# Patient Record
Sex: Male | Born: 1985 | Race: White | Hispanic: No | Marital: Married | Smoking: Former smoker
Health system: Southern US, Community
[De-identification: ages and names within clinical notes are randomized; demographics above are authoritative.]

## PROBLEM LIST (undated history)

## (undated) DIAGNOSIS — I861 Scrotal varices: Secondary | ICD-10-CM

## (undated) HISTORY — PX: WISDOM TOOTH EXTRACTION: SHX21

## (undated) HISTORY — PX: OTHER SURGICAL HISTORY: SHX169

---
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
## 2004-03-08 ENCOUNTER — Emergency Department (HOSPITAL_COMMUNITY): Admission: EM | Admit: 2004-03-08 | Discharge: 2004-03-08 | Payer: Self-pay | Admitting: Emergency Medicine

## 2004-03-15 ENCOUNTER — Emergency Department (HOSPITAL_COMMUNITY): Admission: EM | Admit: 2004-03-15 | Discharge: 2004-03-15 | Payer: Self-pay | Admitting: Emergency Medicine

## 2004-08-07 ENCOUNTER — Emergency Department (HOSPITAL_COMMUNITY): Admission: EM | Admit: 2004-08-07 | Discharge: 2004-08-07 | Payer: Self-pay | Admitting: Emergency Medicine

## 2004-08-17 ENCOUNTER — Emergency Department (HOSPITAL_COMMUNITY): Admission: EM | Admit: 2004-08-17 | Discharge: 2004-08-17 | Payer: Self-pay | Admitting: Emergency Medicine

## 2010-10-17 ENCOUNTER — Encounter
Admission: RE | Admit: 2010-10-17 | Discharge: 2010-10-17 | Payer: Self-pay | Source: Home / Self Care | Attending: Family Medicine | Admitting: Family Medicine

## 2010-10-19 ENCOUNTER — Ambulatory Visit (HOSPITAL_COMMUNITY)
Admission: RE | Admit: 2010-10-19 | Discharge: 2010-10-19 | Payer: Self-pay | Source: Home / Self Care | Attending: Gastroenterology | Admitting: Gastroenterology

## 2011-02-16 NOTE — Op Note (Signed)
[facility]  Patient:    Daniel Garrett, Daniel Garrett                     MRN: [id number] Proc. Date: 03/21/01 Adm. Date:  [id number] Attending:  Trisha Mangle                           Operative Report  PREOPERATIVE DIAGNOSIS:  Left varicocele.  POSTOPERATIVE DIAGNOSIS:  Left varicocele.  PROCEDURE:  Left varicocelectomy.  SURGEON:  Dr. Vernie Ammons.  ANESTHESIA:  General LMA.  SPECIMENS:  None.  DRAINS:  None.  ESTIMATED BLOOD LOSS:  Less than 5 cc.  COMPLICATIONS:  None.  INDICATIONS FOR PROCEDURE:  The patients a 25 year old white male who was found to have a left varicocele on routine physical examination. We have gone over the risks, complications, alternatives and limitations as well as the fertility and testicular development issues associated with varicocele. He understands and has elected to proceed with left varicocelectomy.  DESCRIPTION OF PROCEDURE:  After informed consent, the patient was brought to the major OR, placed on the table, administered general anesthesia and his genitalia was sterilely prepped and draped. The left varicocele was easily seen and a subinguinal incision was then made, carried down through the subcu tissue and superficial fascia where the superficial inferior epigastric vein was identified, ligated with 4-0 silk and divided. The distal 2 cm of the canal were opened by incising the overlying fascia and the ilioinguinal nerve was clearly identified and isolated. The cord was then dissected from the surrounding tissue with a Administrator, [employer] and a Penrose was placed beneath it. The overlying tissues were then incised along the length of the cord. The large veins were identified, doubly ligated with silk suture and divided. The testicular artery was identified and isolated as was the vas. All abnormally enlarged veins were then divided and I then allowed the cord to drop back in its normal anatomic position. The distal  external oblique fascia was reapproximated with a running 3-0 Vicryl suture to cover the inguinal canal back to its normal distance. I then infiltrated the subcu tissue in the area of the ilioinguinal nerve with 0.5% plain Marcaine. The deep fascia was closed with running 3-0 Vicryl and interrupted 3-0 chromic were used to reapproximate the subcu tissue loosely. I then closed the skin incision with a running 4-0 subcuticular Vicryl suture. Dermabond was then applied to the incision and the patient was awakened and taken to the recovery room in stable satisfactory condition. The patient tolerated the procedure well and there were no intraoperative complications. Needle, sponge and instrument counts were reported as correct at the end of the operation.  He will given a prescription for 38 Vicodin ES and follow-up in my office in 7-10 days. DD:  03/21/01 TD:  03/21/01 Job: [id number]

## 2012-03-04 ENCOUNTER — Emergency Department: Payer: Self-pay | Admitting: Emergency Medicine

## 2012-05-23 IMAGING — CT CT ABD-PELV W/ CM
2 of 4 series · 17 of 46 positions shown, 19 images · IV contrast ([ID] [id number])
Comparison: None.

CLINICAL DATA: Right lower quadrant abdominal pain.  Anorexia.

CT ABDOMEN AND PELVIS WITH CONTRAST 10/17/2010:
TECHNIQUE: Multidetector CT imaging of the abdomen and pelvis was
performed following the standard protocol during bolus
administration of intravenous contrast.
Contrast: 125 ml 1mnipaque-CUU IV.  Oral contrast also
administered.

[Series 2: abdomen w/ · axial · 0.70mm/px · z∈[-378,-18]mm · 14 of 80 slices shown, 16 images]
[im 4/80  soft-tissue]
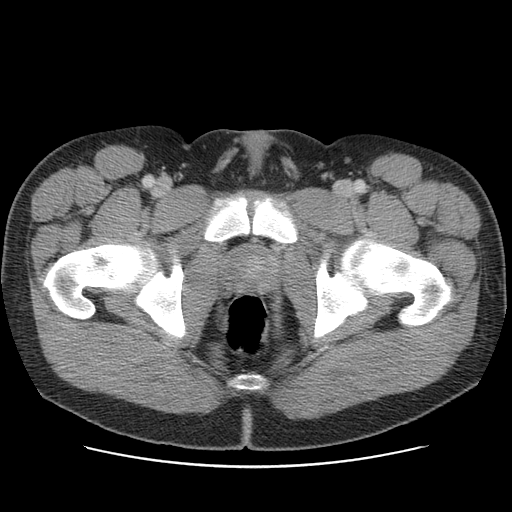
[im 4/80  bone]
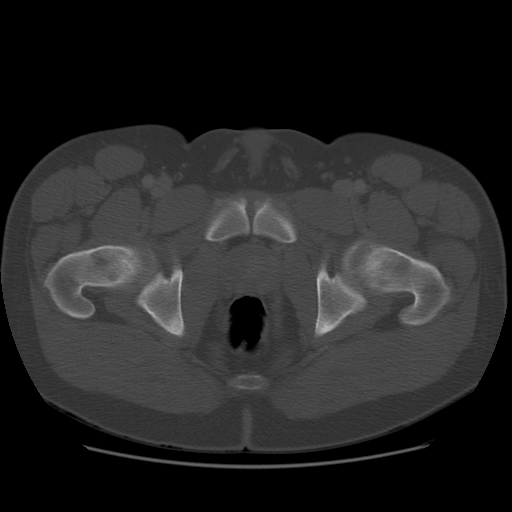
[im 10/80  soft-tissue]
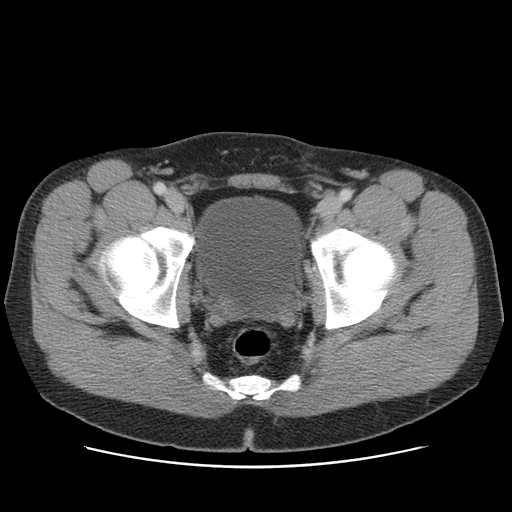
[im 16/80  soft-tissue]
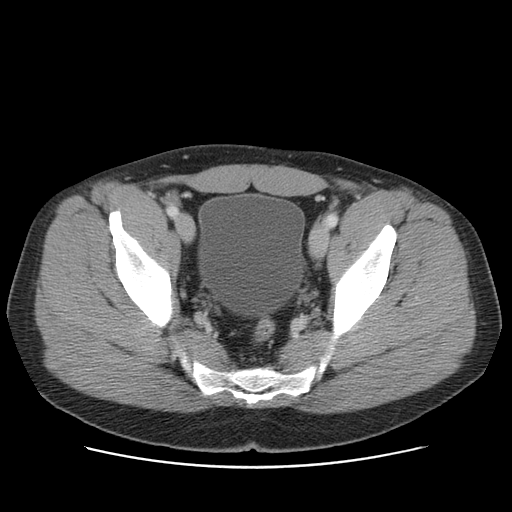
[im 23/80  soft-tissue]
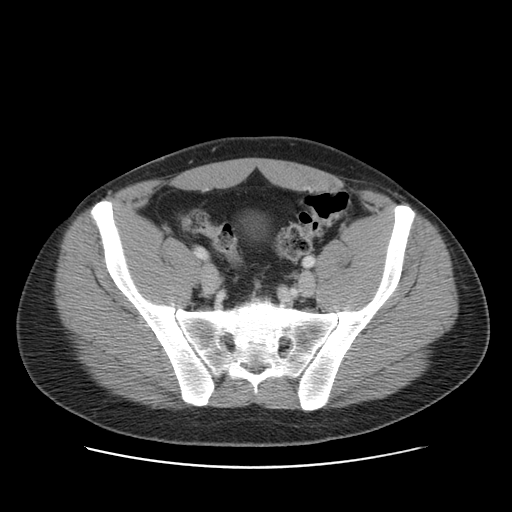
[im 26/80  soft-tissue]
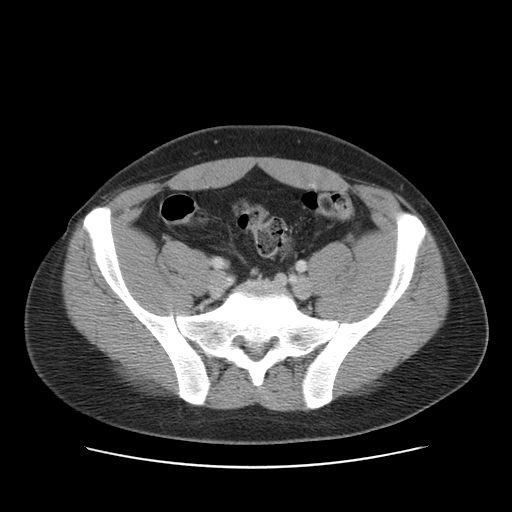
[im 32/80  soft-tissue]
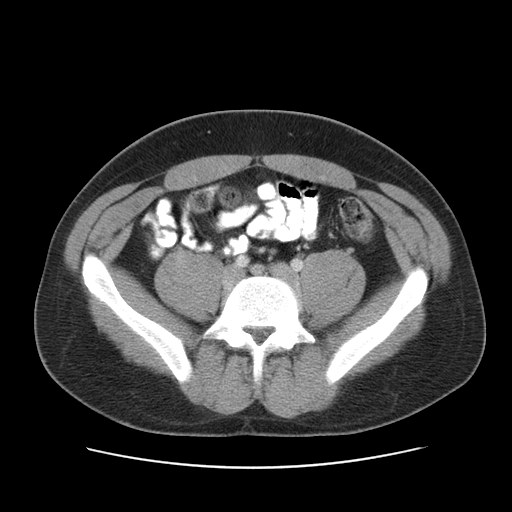
[im 38/80  soft-tissue]
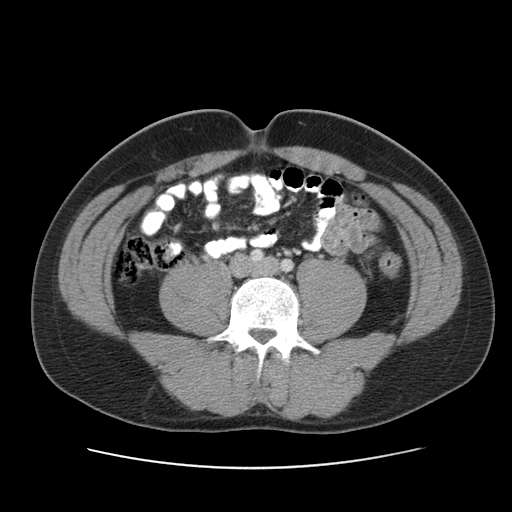
[im 42/80  soft-tissue]
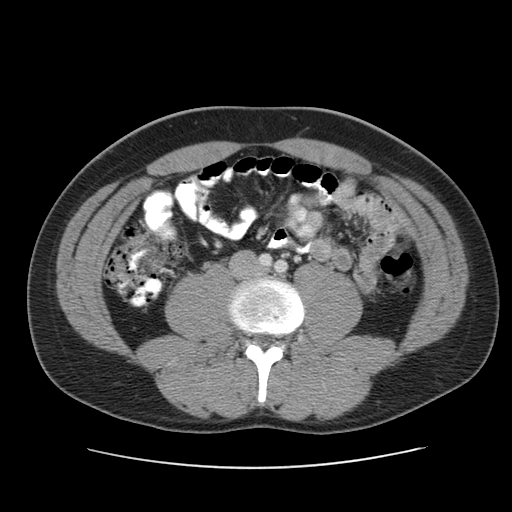
[im 48/80  soft-tissue]
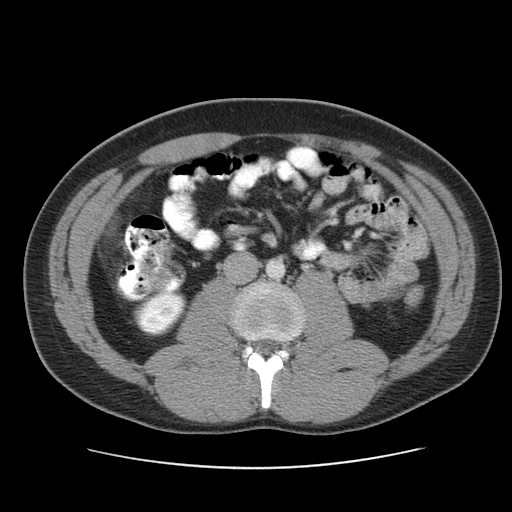
[im 48/80  bone]
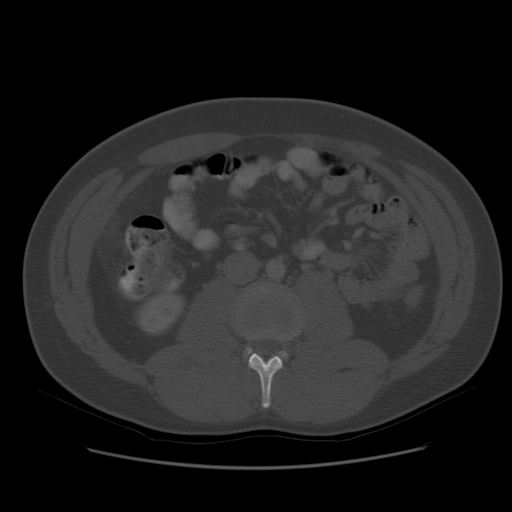
[im 54/80  soft-tissue]
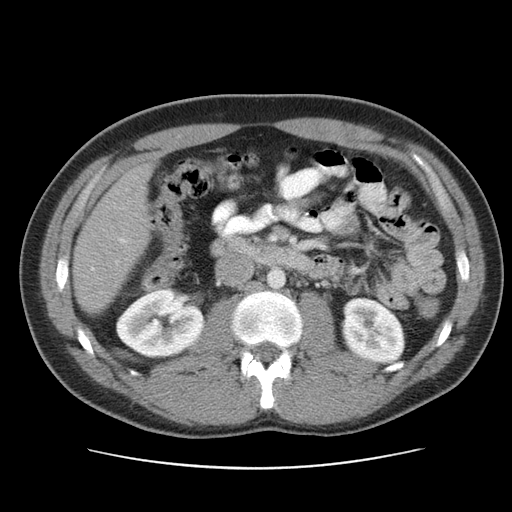
[im 61/80  soft-tissue]
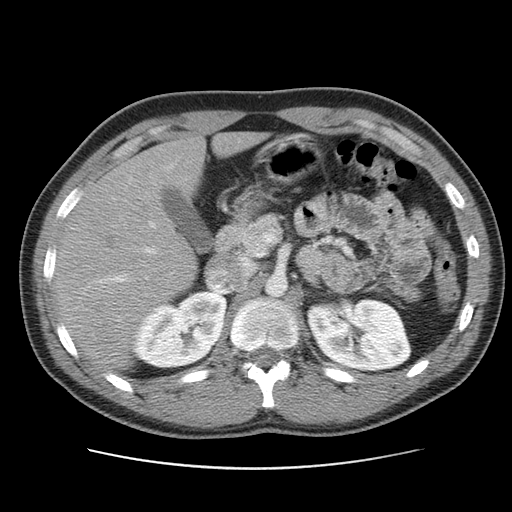
[im 64/80  soft-tissue]
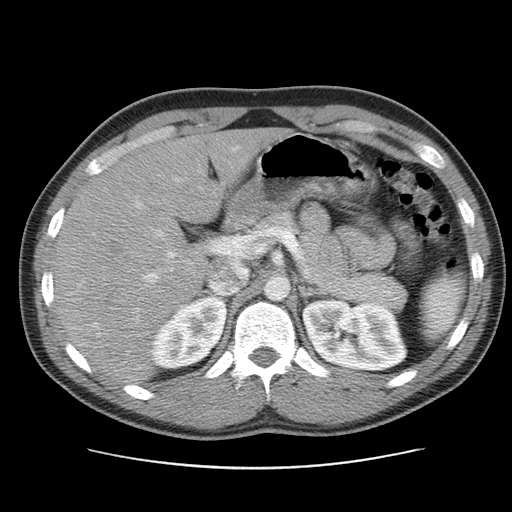
[im 70/80  soft-tissue]
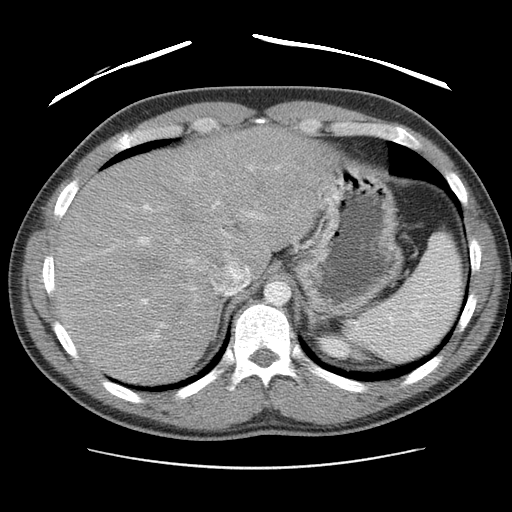
[im 76/80  soft-tissue]
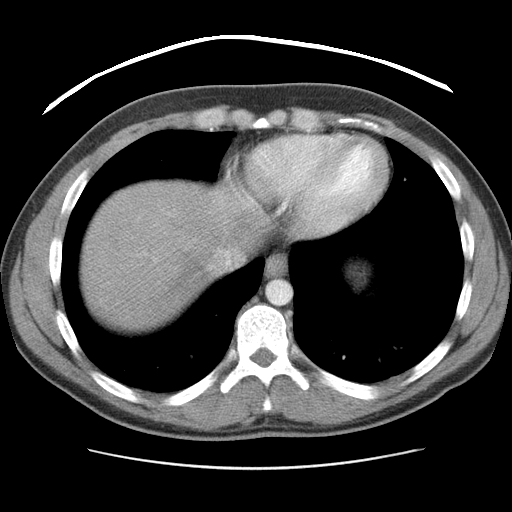

[Series 400: cor · coronal · 0.86mm/px · 3 of 104 slices shown]
[im 35/104  soft-tissue]
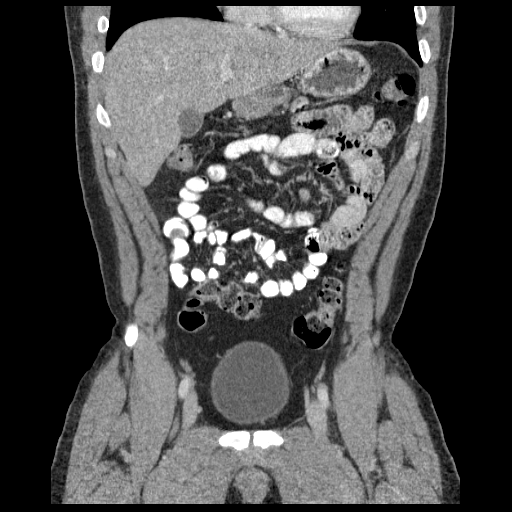
[im 46/104  soft-tissue]
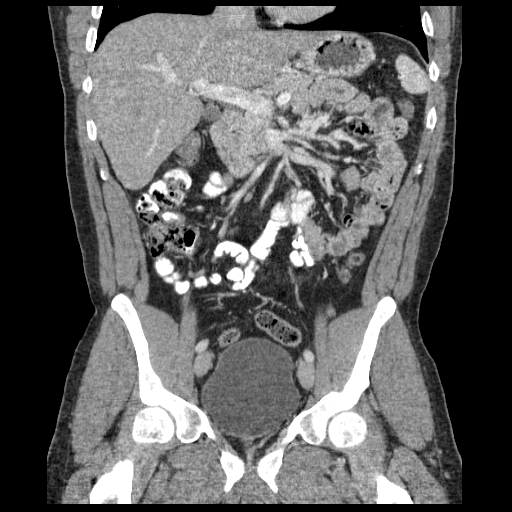
[im 58/104  soft-tissue]
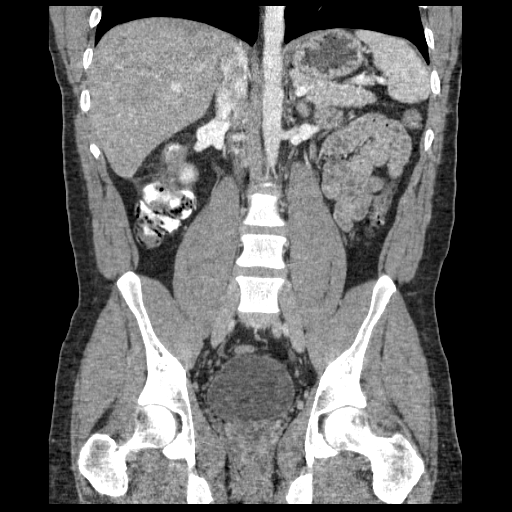

[17 of 46 positions shown; findings below may reference images not displayed]

FINDINGS: Normal appearing appendix identified in the right mid
abdomen, anterior to the right psoas muscle.  Stomach and small
bowel normal in appearance.  Focal wall thickening involving the
ascending colon with surrounding edema/inflammation in the adjacent
fat.  Remainder of the colon unremarkable.  No ascites.

Mild diffuse hepatic steatosis without focal hepatic parenchymal
abnormality.  Normal appearing spleen, pancreas, adrenal glands,
and kidneys.  Gallbladder unremarkable by CT.  No biliary ductal
dilation.  No significant lymphadenopathy.  Normal-appearing aorto-
iliofemoral arterial systems and visceral arteries.

Urinary bladder unremarkable.  Prostate gland seminal vesicles
normal for age.  Phleboliths in the pelvis.  Visualized lung bases
clear.  Bone window images unremarkable.
IMPRESSION: 1.  Focal colitis involving the ascending colon just above the
ileocecal valve.
2.  No evidence of acute appendicitis.
3.  Mild diffuse hepatic steatosis without focal hepatic
parenchymal abnormality.

## 2014-10-18 ENCOUNTER — Encounter (HOSPITAL_COMMUNITY): Payer: Self-pay | Admitting: Emergency Medicine

## 2014-10-18 ENCOUNTER — Emergency Department (HOSPITAL_COMMUNITY): Payer: BLUE CROSS/BLUE SHIELD

## 2014-10-18 ENCOUNTER — Emergency Department (HOSPITAL_COMMUNITY)
Admission: EM | Admit: 2014-10-18 | Discharge: 2014-10-19 | Disposition: A | Discharge status: Home / Self Care | Payer: BLUE CROSS/BLUE SHIELD | Attending: Emergency Medicine | Admitting: Emergency Medicine

## 2014-10-18 DIAGNOSIS — G8911 Acute pain due to trauma: Secondary | ICD-10-CM | POA: Insufficient documentation

## 2014-10-18 DIAGNOSIS — M7989 Other specified soft tissue disorders: Secondary | ICD-10-CM | POA: Diagnosis present

## 2014-10-18 DIAGNOSIS — Z720 Tobacco use: Secondary | ICD-10-CM | POA: Insufficient documentation

## 2014-10-18 DIAGNOSIS — Z880 Allergy status to penicillin: Secondary | ICD-10-CM | POA: Insufficient documentation

## 2014-10-18 MED ORDER — NAPROXEN 500 MG PO TABS
500.0000 mg | ORAL_TABLET | Freq: Once | ORAL | Status: AC
  Administered 2014-10-18: 500 mg via ORAL
  Filled 2014-10-18: qty 1

## 2014-10-18 MED ORDER — NAPROXEN 500 MG PO TABS: 500.0000 mg | ORAL_TABLET | Freq: Two times a day (BID) | ORAL | Status: DC

## 2014-10-18 MED ORDER — DOXYCYCLINE HYCLATE 100 MG PO CAPS: 100.0000 mg | ORAL_CAPSULE | Freq: Two times a day (BID) | ORAL | Status: DC

## 2014-10-18 NOTE — ED Provider Notes (Signed)
CSN: [id number]     Arrival date & time 10/18/14  2003 History  This chart was scribed for non-physician practitioner, Antony Madura, PA-C working with Flint Melter, MD by Freida Busman, ED Scribe. This patient was seen in room WTR5/WTR5 and the patient's care was started at  10:51 PM   Chief Complaint  Patient presents with  . Hand Pain    The history is provided by the patient. No language interpreter was used.   HPI Comments:  Daniel Garrett is a 29 y.o. male who presents to the Emergency Department complaining of mild-moderate swelling to his right pinky that started 1900 today. Pt notes he injured the same finger ~1 month ago with a chainsaw. He denies acute injury, increased warmth, redness and  loss of sensation to the digit. Tetanus is UTD within the last 4 years. No alleviating factors noted.  History reviewed. No pertinent past medical history. Past Surgical History  Procedure Laterality Date  . Testicular varicose vein surgery    . Wisdom tooth extraction     History reviewed. No pertinent family history. History  Substance Use Topics  . Smoking status: Current Every Day Smoker -- 1.00 packs/day  . Smokeless tobacco: Not on file  . Alcohol Use: Yes    Review of Systems  Constitutional: Negative for fever and chills.  Musculoskeletal: Positive for myalgias and arthralgias.  All other systems reviewed and are negative.   Allergies  Amoxicillin and Penicillins  Home Medications   Prior to Admission medications   Medication Sig Start Date End Date Taking? Authorizing Provider  doxycycline (VIBRAMYCIN) 100 MG capsule Take 1 capsule (100 mg total) by mouth 2 (two) times daily. Take with meals 10/18/14   Antony Madura, PA-C  naproxen (NAPROSYN) 500 MG tablet Take 1 tablet (500 mg total) by mouth 2 (two) times daily with a meal. 10/18/14   Antony Madura, PA-C   BP 145/72 mmHg  Pulse 95  Temp(Src) 98.3 F (36.8 C) (Oral)  SpO2 98%   Physical Exam  Constitutional: He  is oriented to person, place, and time. He appears well-developed and well-nourished. No distress.  Nontoxic/nonseptic appearing  HENT:  Head: Normocephalic and atraumatic.  Eyes: Conjunctivae and EOM are normal. No scleral icterus.  Neck: Normal range of motion.  Cardiovascular: Normal rate, regular rhythm and intact distal pulses.   Distal radial pulse 2+ in RUE  Pulmonary/Chest: Effort normal. No respiratory distress.  Musculoskeletal: Normal range of motion.       Right wrist: Normal.       Right hand: He exhibits tenderness and swelling (Mild to ulnar aspect of right hand). He exhibits normal range of motion, no bony tenderness, normal capillary refill and no deformity. Normal sensation noted. Normal strength noted.       Hands: Neurological: He is alert and oriented to person, place, and time. He exhibits normal muscle tone. Coordination normal.  Sensation to light touch intact. Finger to thumb opposition intact. Normal grip strength in right upper extremity.  Skin: Skin is warm and dry. No rash noted. He is not diaphoretic. No erythema. No pallor.  Scab appreciated over the right fifth MCP joint. No induration, weeping, or purulent drainage. No heat to touch or red linear streaking.  Psychiatric: He has a normal mood and affect. His behavior is normal.  Nursing note and vitals reviewed.   ED Course  Procedures   DIAGNOSTIC STUDIES:  Oxygen Saturation is 98% on RA, normal by my interpretation.  COORDINATION OF CARE:  10:54 PM Will order XR of the right hand and order naproxen. Discussed treatment plan with pt at bedside and pt agreed to plan.  Labs Review Labs Reviewed - No data to display  Imaging Review Dg Hand Complete Right  10/18/2014   CLINICAL DATA:  Pt states he cut proximal 5th finger posteriorly with chainsaw one month ago. No complaints until today it started to swell and hurt into 5th metacarpal.  EXAM: RIGHT HAND - COMPLETE 3+ VIEW  COMPARISON:  None.   FINDINGS: There is no evidence of fracture or dislocation. There is no evidence of arthropathy or other focal bone abnormality. Soft tissues are unremarkable.  IMPRESSION: Negative.   Electronically Signed   By: Amie Portlandavid  Ormond M.D.   On: 10/18/2014 23:17     EKG Interpretation None      MDM   Final diagnoses:  Swelling of right hand    29 year old male presents to the emergency department for further evaluation of right hand pain and swelling. He states that he injured his hand one month ago, but noticed some increased swelling with pain today. He denies any recent trauma or injury to the area. Patient is neurovascularly intact. No evidence of septic joint. X-ray negative for fracture or bony deformity. No evidence of osteomyelitis. Given chainsaw injury 1 month ago, will cover for early infection with course of doxycycline; my suspicion for this is low, however. Patient has advised to use and ACE wrap and RICE with Naproxen as well. Orthopedic hand specialist referral provided should symptoms persist or worsen. Return precautions discussed. Patient agreeable to plan with no unaddressed concerns.  I personally performed the services described in this documentation, which was scribed in my presence. The recorded information has been reviewed and is accurate.   Filed Vitals:   10/18/14 2009  BP: 145/72  Pulse: 95  Temp: 98.3 F (36.8 C)  TempSrc: Oral  SpO2: 98%      Antony MaduraKelly Malic Rosten, PA-C 10/19/14 [id number]  Flint MelterElliott L Wentz, MD 10/19/14 [id number]

## 2014-10-18 NOTE — Discharge Instructions (Signed)
Take doxycycline as prescribed to cover for infection. Take naproxen as prescribed for pain and swelling. Apply ice 3-4 times per day. Wear an Ace wrap for compression to prevent further swelling. Follow-up with a hand specialist if symptoms persist.  RICE: Routine Care for Injuries The routine care of many injuries includes Rest, Ice, Compression, and Elevation (RICE). HOME CARE INSTRUCTIONS  Rest is needed to allow your body to heal. Routine activities can usually be resumed when comfortable. Injured tendons and bones can take up to 6 weeks to heal. Tendons are the cord-like structures that attach muscle to bone.  Ice following an injury helps keep the swelling down and reduces pain.  Put ice in a plastic bag.  Place a towel between your skin and the bag.  Leave the ice on for 15-20 minutes, 3-4 times a day, or as directed by your health care provider. Do this while awake, for the first 24 to 48 hours. After that, continue as directed by your caregiver.  Compression helps keep swelling down. It also gives support and helps with discomfort. If an elastic bandage has been applied, it should be removed and reapplied every 3 to 4 hours. It should not be applied tightly, but firmly enough to keep swelling down. Watch fingers or toes for swelling, bluish discoloration, coldness, numbness, or excessive pain. If any of these problems occur, remove the bandage and reapply loosely. Contact your caregiver if these problems continue.  Elevation helps reduce swelling and decreases pain. With extremities, such as the arms, hands, legs, and feet, the injured area should be placed near or above the level of the heart, if possible. SEEK IMMEDIATE MEDICAL CARE IF:  You have persistent pain and swelling.  You develop redness, numbness, or unexpected weakness.  Your symptoms are getting worse rather than improving after several days. These symptoms may indicate that further evaluation or further X-rays are  needed. Sometimes, X-rays may not show a small broken bone (fracture) until 1 week or 10 days later. Make a follow-up appointment with your caregiver. Ask when your X-ray results will be ready. Make sure you get your X-ray results. Document Released: 12/30/2000 Document Revised: 09/22/2013 Document Reviewed: 02/16/2011 [facility] Patient Information 2015 [place], [place]. This information is not intended to replace advice given to you by your health care provider. Make sure you discuss any questions you have with your health care provider.

## 2014-10-18 NOTE — ED Notes (Signed)
Patient states that he cut his R pinky finger with a chain saw approx. 1 month ago and today it started swelling and having pain. Alert and oriented.

## 2014-10-18 NOTE — ED Notes (Signed)
Pt reports R hand pain.  Injured his R hand about a month ago with a chainsaw.  Pt reports today he noticed the injured area to be swollen and painful.

## 2015-04-26 ENCOUNTER — Encounter: Payer: Self-pay | Admitting: Emergency Medicine

## 2015-04-26 ENCOUNTER — Emergency Department
Admission: EM | Admit: 2015-04-26 | Discharge: 2015-04-26 | Disposition: A | Discharge status: Home / Self Care | Payer: BLUE CROSS/BLUE SHIELD | Attending: Emergency Medicine | Admitting: Emergency Medicine

## 2015-04-26 DIAGNOSIS — Z880 Allergy status to penicillin: Secondary | ICD-10-CM | POA: Insufficient documentation

## 2015-04-26 DIAGNOSIS — Z720 Tobacco use: Secondary | ICD-10-CM | POA: Insufficient documentation

## 2015-04-26 DIAGNOSIS — M2660 Temporomandibular joint disorder, unspecified: Secondary | ICD-10-CM | POA: Insufficient documentation

## 2015-04-26 DIAGNOSIS — J039 Acute tonsillitis, unspecified: Secondary | ICD-10-CM | POA: Insufficient documentation

## 2015-04-26 DIAGNOSIS — M26609 Unspecified temporomandibular joint disorder, unspecified side: Secondary | ICD-10-CM

## 2015-04-26 MED ORDER — IBUPROFEN 800 MG PO TABS: 800.0000 mg | ORAL_TABLET | Freq: Three times a day (TID) | ORAL | Status: DC | PRN

## 2015-04-26 MED ORDER — HYDROCODONE-ACETAMINOPHEN 5-325 MG PO TABS: 1.0000 | ORAL_TABLET | ORAL | Status: DC | PRN

## 2015-04-26 MED ORDER — METHOCARBAMOL 500 MG PO TABS: 500.0000 mg | ORAL_TABLET | Freq: Four times a day (QID) | ORAL | Status: DC | PRN

## 2015-04-26 MED ORDER — AZITHROMYCIN 250 MG PO TABS: ORAL_TABLET | ORAL | Status: DC

## 2015-04-26 NOTE — ED Notes (Signed)
Pt comes into the ED c/o jaw pain and his jaw being locked up.  Denies any tooth pain or fevers.  States "It is the bottom gum on the right that is swollen", "It feels like I bit down on my jaw wrong".

## 2015-04-26 NOTE — Discharge Instructions (Signed)
Jaw °Range of Motion Exercises °Do the exercises below to prevent problems opening your mouth after a jaw fracture, TMJ, or surgery. °HOME CARE °Warm up °· Open your mouth as wide as possible for 15 seconds. °· Then, close your mouth and rest. Repeat this 8 times. °Exercises °· Stick your jaw forward for 15 seconds. Rest your jaw. Repeat this 8 times. °· Move your jaw right for 15 seconds. Rest your jaw and repeat 8 times. °· Move your jaw left for 15 seconds. Rest your jaw and repeat 8 times. °· Put your middle finger and thumb in your mouth. Push with your thumb on your top teeth and your middle finger on your bottom teeth opening your mouth. °· Stretch your mouth open as far as possible. Repeat 8 times. °· Chew gum when you are not doing exercises. °· Use moist heat packs 3 to 4 times a day on your jaw area. °Document Released: 08/30/2008 Document Revised: 12/10/2011 Document Reviewed: 08/30/2008 °ExitCare® Patient Information ©2015 [employer]. This information is not intended to replace advice given to you by your health care provider. Make sure you discuss any questions you have with your health care provider. °Temporomandibular Problems  °Temporomandibular joint (TMJ) dysfunction means there are problems with the joint between your jaw and your skull. This is a joint lined by cartilage like other joints in your body but also has a small disc in the joint which keeps the bones from rubbing on each other. These joints are like other joints and can get inflamed (sore) from arthritis and other problems. When this joint gets sore, it can cause headaches and pain in the jaw and the face. °CAUSES  °Usually the arthritic types of problems are caused by soreness in the joint. Soreness in the joint can also be caused by overuse. This may come from grinding your teeth. It may also come from mis-alignment in the joint. °DIAGNOSIS °Diagnosis of this condition can often be made by history and exam. Sometimes your  caregiver may need X-rays or an MRI scan to determine the exact cause. It may be necessary to see your dentist to determine if your teeth and jaws are lined up correctly. °TREATMENT  °Most of the time this problem is not serious; however, sometimes it can persist (become chronic). When this happens medications that will cut down on inflammation (soreness) help. Sometimes a shot of cortisone into the joint will be helpful. If your teeth are not aligned it may help for your dentist to make a splint for your mouth that can help this problem. If no physical problems can be found, the problem may come from tension. If tension is found to be the cause, biofeedback or relaxation techniques may be helpful. °HOME CARE INSTRUCTIONS  °· Later in the day, applications of ice packs may be helpful. Ice can be used in a plastic bag with a towel around it to prevent frostbite to skin. This may be used about every 2 hours for 20 to 30 minutes, as needed while awake, or as directed by your caregiver. °· Only take over-the-counter or prescription medicines for pain, discomfort, or fever as directed by your caregiver. °· If physical therapy was prescribed, follow your caregiver's directions. °· Wear mouth appliances as directed if they were given. °Document Released: 06/12/2001 Document Revised: 12/10/2011 Document Reviewed: 09/19/2008 °ExitCare® Patient Information ©2015 [employer] LLC. This information is not intended to replace advice given to you by your health care provider. Make sure you discuss any questions   questions you have with your health care provider.  Tonsillitis Tonsillitis is an infection of the throat. This infection causes the tonsils to become red, tender, and puffy (swollen). Tonsils are groups of tissue at the back of your throat. If bacteria caused your infection, antibiotic medicine will be given to you. Sometimes symptoms of tonsillitis can be relieved with the use of steroid medicine. If your tonsillitis is severe and  happens often, you may need to get your tonsils removed (tonsillectomy). HOME CARE   Rest and sleep often.  Drink enough fluids to keep your pee (urine) clear or pale yellow.  While your throat is sore, eat soft or liquid foods like:  Soup.  Ice cream.  Instant breakfast drinks.  Eat frozen ice pops.  Gargle with a warm or cold liquid to help soothe the throat. Gargle with a water and salt mix. Mix 1/4 teaspoon of salt and 1/4 teaspoon of baking soda in 1 cup of water.  Only take medicines as told by your doctor.  If you are given medicines (antibiotics), take them as told. Finish them even if you start to feel better. GET HELP IF:  You have large, tender lumps in your neck.  You have a rash.  You cough up green, yellow-brown, or bloody fluid.  You cannot swallow liquids or food for 24 hours.  You notice that only one of your tonsils is swollen. GET HELP RIGHT AWAY IF:   You throw up (vomit).  You have a very bad headache.  You have a stiff neck.  You have chest pain.  You have trouble breathing or swallowing.  You have bad throat pain, drooling, or your voice changes.  You have bad pain not helped by medicine.  You cannot fully open your mouth.  You have redness, puffiness, or bad pain in the neck.  You have a fever. MAKE SURE YOU:   Understand these instructions.  Will watch your condition.  Will get help right away if you are not doing well or get worse. Document Released: 03/05/2008 Document Revised: 09/22/2013 Document Reviewed: 03/06/2013 [facility] Patient Information 2015 [place], [place]. This information is not intended to replace advice given to you by your health care provider. Make sure you discuss any questions you have with your health care provider.

## 2015-04-26 NOTE — ED Provider Notes (Signed)
[facility] Emergency Department Provider Note  ____________________________________________  Time seen: Approximately 3:45 PM  I have reviewed the triage vital signs and the nursing notes.   HISTORY  Chief Complaint Jaw Pain   HPI Daniel Garrett is a 29 y.o. male resents with complaints of right jaw pain, throat pain. Patient states her last couple days his jaw has been hurting and he is having a difficult time opening his mouth. States also that he notices his tonsils are swollen. Positive for fever and chills, low-grade temperature right now of 99.  History reviewed. No pertinent past medical history.  There are no active problems to display for this patient.   Past Surgical History  Procedure Laterality Date  . Testicular varicose vein surgery    . Wisdom tooth extraction      Current Outpatient Rx  Name  Route  Sig  Dispense  Refill  . azithromycin (ZITHROMAX Z-PAK) 250 MG tablet      Take 2 tablets (500 mg) on  Day 1,  followed by 1 tablet (250 mg) once daily on Days 2 through 5.   6 each   0   . doxycycline (VIBRAMYCIN) 100 MG capsule   Oral   Take 1 capsule (100 mg total) by mouth 2 (two) times daily. Take with meals   14 capsule   0   . HYDROcodone-acetaminophen (NORCO) 5-325 MG per tablet   Oral   Take 1-2 tablets by mouth every 4 (four) hours as needed for moderate pain.   15 tablet   0   . ibuprofen (ADVIL,MOTRIN) 800 MG tablet   Oral   Take 1 tablet (800 mg total) by mouth every 8 (eight) hours as needed.   30 tablet   0   . methocarbamol (ROBAXIN) 500 MG tablet   Oral   Take 1 tablet (500 mg total) by mouth every 6 (six) hours as needed for muscle spasms.   30 tablet   0   . naproxen (NAPROSYN) 500 MG tablet   Oral   Take 1 tablet (500 mg total) by mouth 2 (two) times daily with a meal.   30 tablet   0     Allergies Amoxicillin and Penicillins  No family history on file.  Social History History   Substance Use Topics  . Smoking status: Current Every Day Smoker -- 1.00 packs/day  . Smokeless tobacco: Not on file  . Alcohol Use: Yes    Review of Systems Constitutional: No fever/chills Eyes: No visual changes. ENT: Positive sore throat, positive right jaw pain. Cardiovascular: Denies chest pain. Respiratory: Denies shortness of breath. Gastrointestinal: No abdominal pain.  No nausea, no vomiting.  No diarrhea.  No constipation. Genitourinary: Negative for dysuria. Musculoskeletal: Negative for back pain. Skin: Negative for rash. Neurological: Negative for headaches, focal weakness or numbness.  10-point ROS otherwise negative.  ____________________________________________   PHYSICAL EXAM:  VITAL SIGNS: ED Triage Vitals  Enc Vitals Group     BP 04/26/15 1525 126/71 mmHg     Pulse Rate 04/26/15 1525 94     Resp 04/26/15 1525 16     Temp 04/26/15 1525 99 F (37.2 C)     Temp Source 04/26/15 1525 Oral     SpO2 04/26/15 1525 96 %     Weight 04/26/15 1525 163 lb (73.936 kg)     Height 04/26/15 1525 5\' 9"  (1.753 m)     Head Cir --      Peak Flow --  Pain Score 04/26/15 1526 7     Pain Loc --      Pain Edu? --      Excl. in GC? --     Constitutional: Alert and oriented. Well appearing and in no acute distress. Head: Atraumatic. Nose: No congestion/rhinnorhea. Mouth/Throat: Mucous membranes are moist.  Oropharynx erythematous with tonsillar enlargement.. Limited range of motion of jaw difficulty opening. Neck: No stridor. Positive cervical adenopathy on the right tender  Cardiovascular: Normal rate, regular rhythm. Grossly normal heart sounds.  Good peripheral circulation. Respiratory: Normal respiratory effort.  No retractions. Lungs CTAB. Musculoskeletal: No lower extremity tenderness nor edema.  No joint effusions. Neurologic:  Normal speech and language. No gross focal neurologic deficits are appreciated. No gait instability. Skin:  Skin is warm, dry and  intact. No rash noted. Psychiatric: Mood and affect are normal. Speech and behavior are normal.  ____________________________________________   LABS (all labs ordered are listed, but only abnormal results are displayed)  Labs Reviewed - No data to display ____________________________________________     PROCEDURES  Procedure(s) performed: None  Critical Care performed: No  ____________________________________________   INITIAL IMPRESSION / ASSESSMENT AND PLAN / ED COURSE  Pertinent labs & imaging results that were available during my care of the patient were reviewed by me and considered in my medical decision making (see chart for details).  TMJ. Tonsillitis. Rx given for Zithromax secondary to penicillin allergy. Motrin 800 and Flexeril 10 mg 3 times a day to relax his jaw muscles. ____________________________________________   FINAL CLINICAL IMPRESSION(S) / ED DIAGNOSES  Final diagnoses:  TMJ (temporomandibular joint syndrome)  Acute tonsillitis      Evangeline Dakin, PA-C 04/26/15 1602  Sharman Cheek, MD 04/26/15 2210

## 2016-05-24 IMAGING — CR DG HAND COMPLETE 3+V*R*
3 series · 3 of 3 positions shown · non-contrast
Comparison: None.

CLINICAL DATA: Pt states he cut proximal 5th finger posteriorly
with chainsaw one month ago. No complaints until today it started to
swell and hurt into 5th metacarpal.

EXAM:
RIGHT HAND - COMPLETE 3+ VIEW

[x hand pa right]
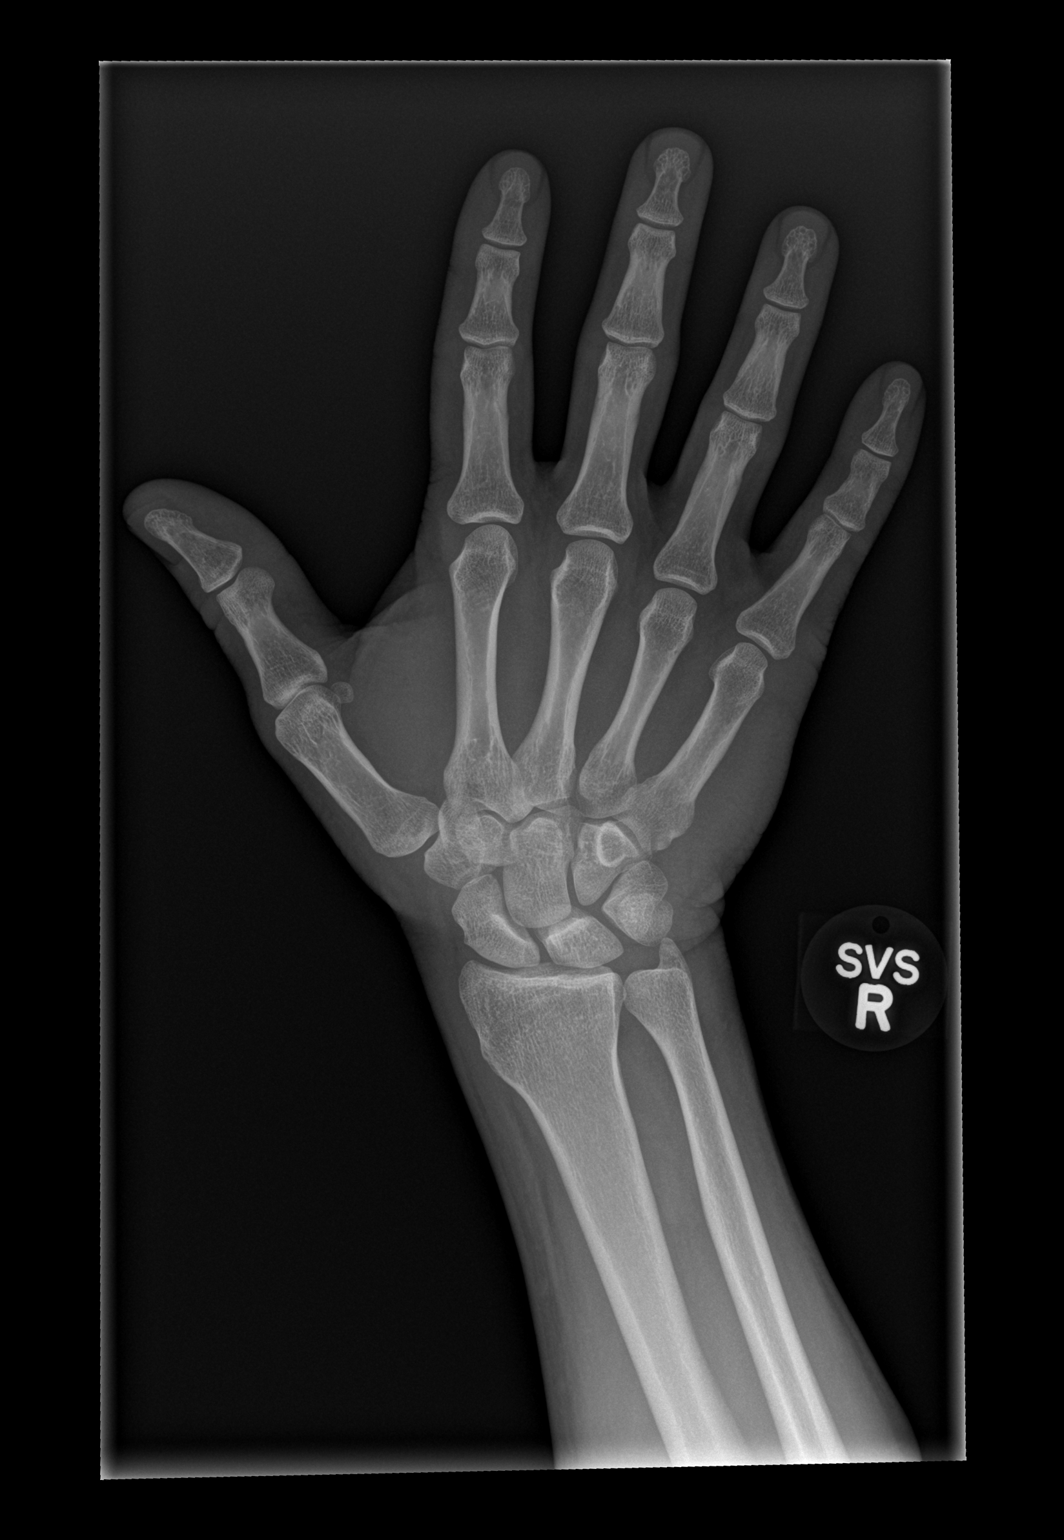

[x hand obl right]
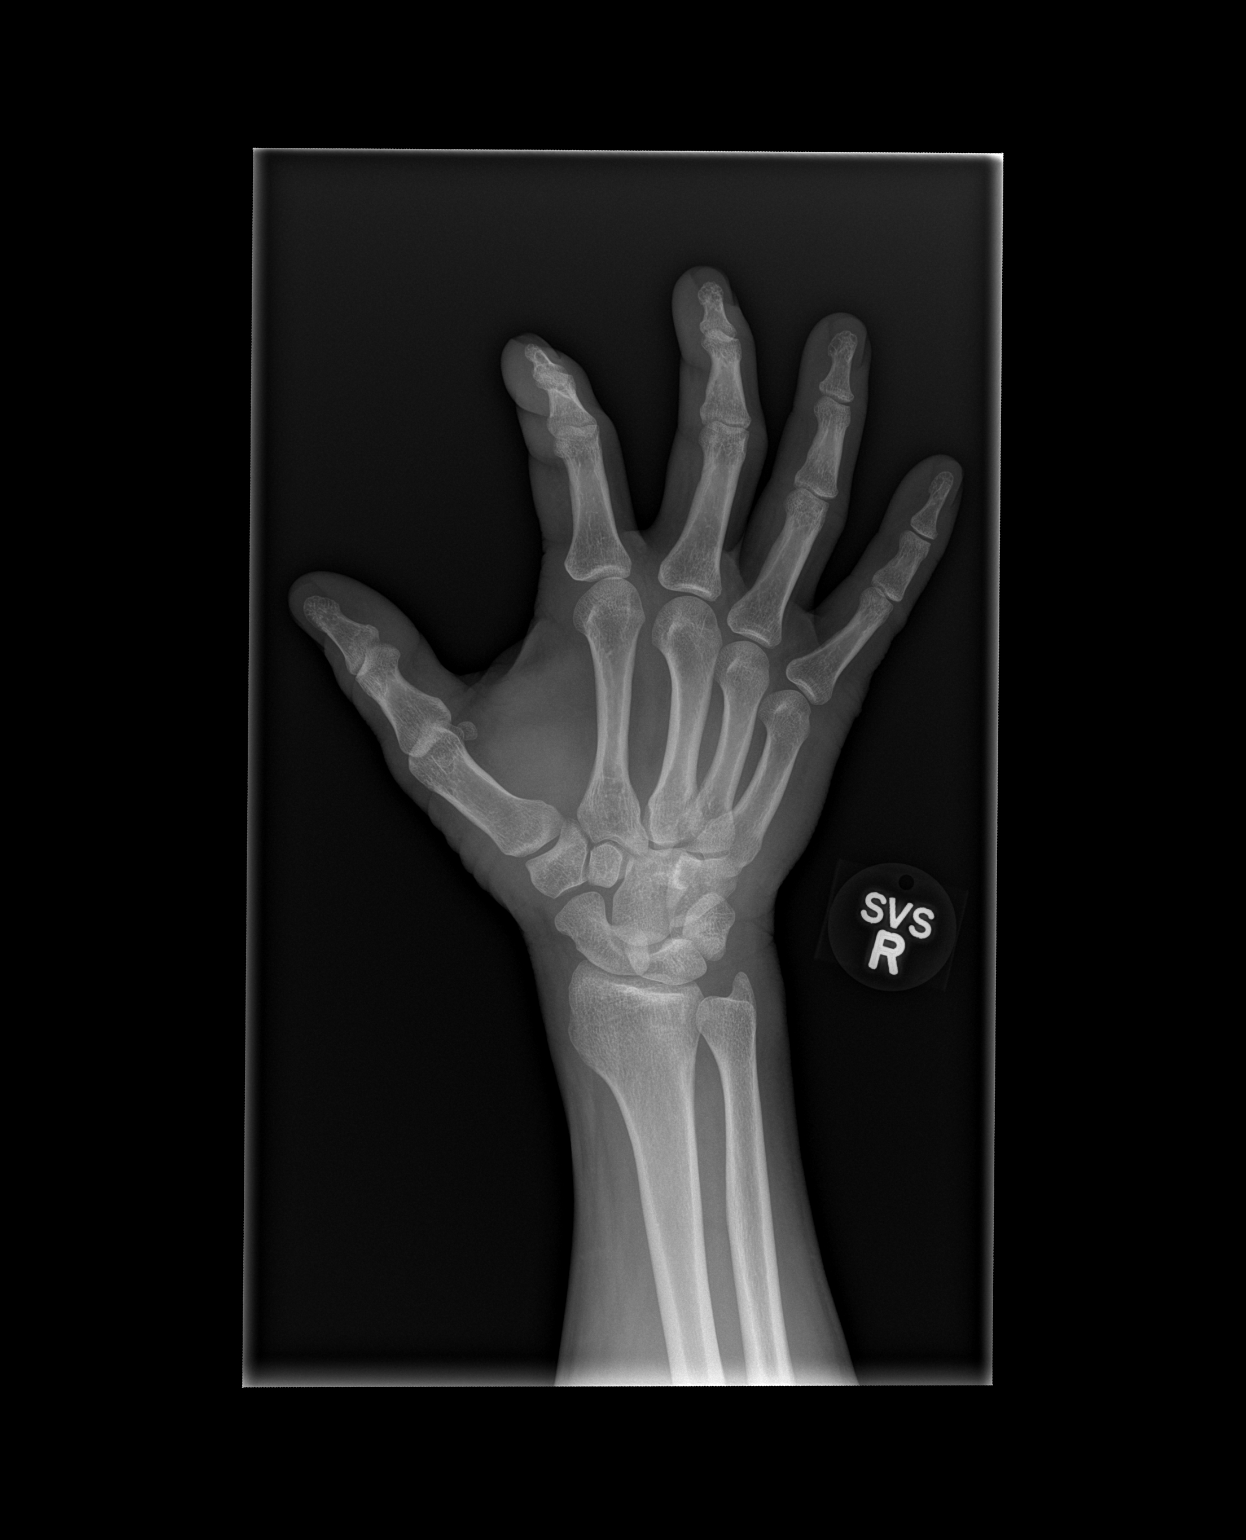

[x hand lat right]
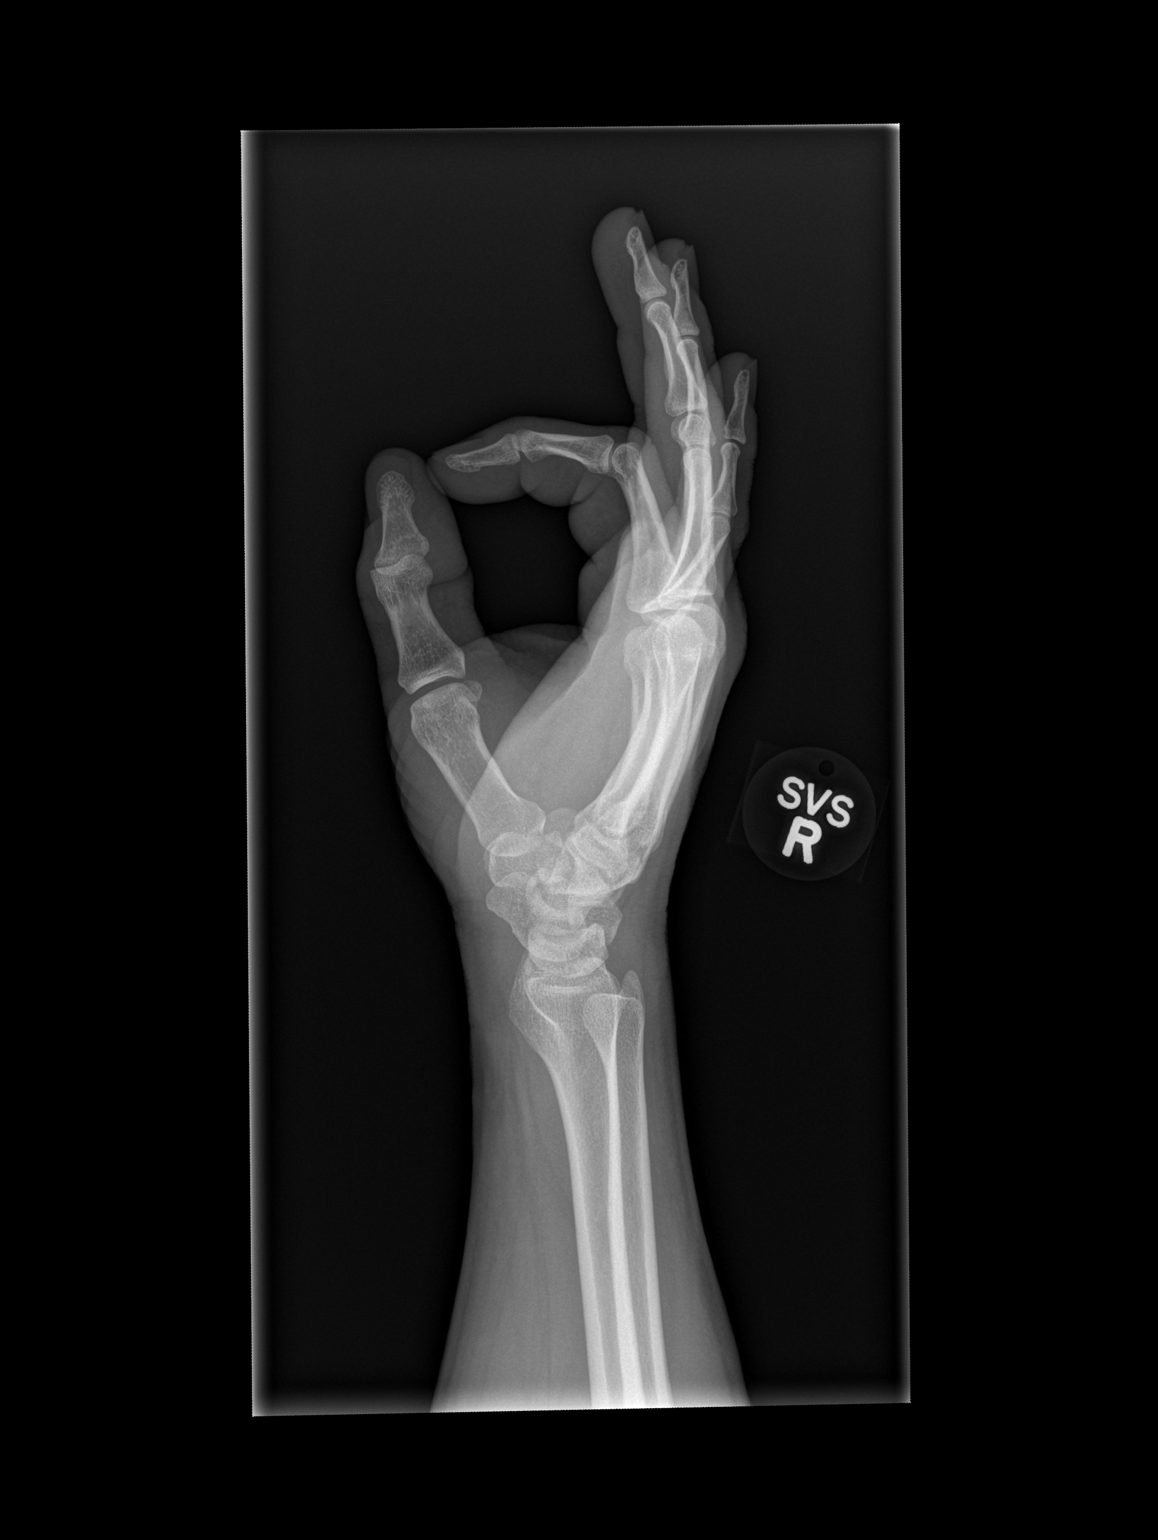

[3 of 3 positions shown; findings below may reference images not displayed]

FINDINGS: There is no evidence of fracture or dislocation. There is no
evidence of arthropathy or other focal bone abnormality. Soft
tissues are unremarkable.
IMPRESSION: Negative.

## 2020-05-28 ENCOUNTER — Other Ambulatory Visit: Payer: Self-pay

## 2020-05-28 ENCOUNTER — Emergency Department
Admission: EM | Admit: 2020-05-28 | Discharge: 2020-05-28 | Disposition: A | Discharge status: Home / Self Care | Payer: Self-pay | Attending: Emergency Medicine | Admitting: Emergency Medicine

## 2020-05-28 DIAGNOSIS — S0181XA Laceration without foreign body of other part of head, initial encounter: Secondary | ICD-10-CM | POA: Insufficient documentation

## 2020-05-28 DIAGNOSIS — W268XXA Contact with other sharp object(s), not elsewhere classified, initial encounter: Secondary | ICD-10-CM | POA: Insufficient documentation

## 2020-05-28 DIAGNOSIS — S51811A Laceration without foreign body of right forearm, initial encounter: Secondary | ICD-10-CM | POA: Insufficient documentation

## 2020-05-28 DIAGNOSIS — Y939 Activity, unspecified: Secondary | ICD-10-CM | POA: Insufficient documentation

## 2020-05-28 DIAGNOSIS — Z23 Encounter for immunization: Secondary | ICD-10-CM | POA: Insufficient documentation

## 2020-05-28 DIAGNOSIS — Y990 Civilian activity done for income or pay: Secondary | ICD-10-CM | POA: Insufficient documentation

## 2020-05-28 DIAGNOSIS — Y929 Unspecified place or not applicable: Secondary | ICD-10-CM | POA: Insufficient documentation

## 2020-05-28 DIAGNOSIS — F17210 Nicotine dependence, cigarettes, uncomplicated: Secondary | ICD-10-CM | POA: Insufficient documentation

## 2020-05-28 MED ORDER — LIDOCAINE-EPINEPHRINE 2 %-1:100000 IJ SOLN: 20.0000 mL | Freq: Once | INTRAMUSCULAR | Status: DC

## 2020-05-28 MED ORDER — LIDOCAINE-EPINEPHRINE (PF) 2 %-1:200000 IJ SOLN
20.0000 mL | Freq: Once | INTRAMUSCULAR | Status: AC
  Administered 2020-05-28: 20 mL via INTRADERMAL

## 2020-05-28 MED ORDER — LIDOCAINE HCL (PF) 1 % IJ SOLN: 5.0000 mL | Freq: Once | INTRAMUSCULAR | Status: DC

## 2020-05-28 NOTE — Discharge Instructions (Addendum)
Keep the wound clean, dry, and covered.  Follow-up with a local urgent care in 14 days for suture removal.

## 2020-05-28 NOTE — ED Triage Notes (Signed)
Reports working on a care and jerked back from something hot and cut right arm.  Patient with large laceration noted to right forearm, bleeding controlled.

## 2020-05-28 NOTE — ED Provider Notes (Signed)
[facility] Emergency Department Provider Note ____________________________________________  Time seen: 2209  I have reviewed the triage vital signs and the nursing notes.  HISTORY  Chief Complaint  Laceration  HPI Daniel Garrett is a 34 y.o. male presents to the ED accompanied by his wife, for evaluation management of a laceration to the right forearm.   Patient apparently cut his right forearm on a large portion of the truck he was working on, after he touched something it was hot.  To do his arm back, he created a large volar laceration to the proximal forearm.  He reports ports now with bleeding controlled, and noted a current tetanus status.  Denies any other injury at this time.  No past medical history on file.  There are no problems to display for this patient.   Past Surgical History:  Procedure Laterality Date  . testicular varicose vein surgery    . WISDOM TOOTH EXTRACTION      Prior to Admission medications   Not on File    Allergies Amoxicillin and Penicillins  No family history on file.  Social History Social History   Tobacco Use  . Smoking status: Current Every Day Smoker    Packs/day: 1.00  Substance Use Topics  . Alcohol use: Yes  . Drug use: No    Review of Systems  Constitutional: Negative for fever. Cardiovascular: Negative for chest pain. Respiratory: Negative for shortness of breath. Musculoskeletal: Negative for back pain. Skin: Negative for rash. Right forearm laceration Neurological: Negative for headaches, focal weakness or numbness. ____________________________________________  PHYSICAL EXAM:  VITAL SIGNS: ED Triage Vitals  Enc Vitals Group     BP 05/28/20 2056 (!) 141/69     Pulse Rate 05/28/20 2056 (!) 110     Resp 05/28/20 2056 17     Temp 05/28/20 2056 98.1 F (36.7 C)     Temp Source 05/28/20 2056 Oral     SpO2 05/28/20 2056 97 %     Weight 05/28/20 2057 178 lb (80.7 kg)     Height 05/28/20  2057 5\' 9"  (1.753 m)     Head Circumference --      Peak Flow --      Pain Score 05/28/20 2059 6     Pain Loc --      Pain Edu? --      Excl. in GC? --     Constitutional: Alert and oriented. Well appearing and in no distress. Head: Normocephalic and atraumatic. Eyes: Conjunctivae are normal. Normal extraocular movements Cardiovascular: Normal rate, regular rhythm. Normal distal pulses. Respiratory: Normal respiratory effort.  Musculoskeletal: Nontender with normal range of motion in all extremities.  Normal composite fist on the right. Neurologic:  Normal gross sensation.  Normal speech and language. No gross focal neurologic deficits are appreciated. Skin:  Skin is warm, dry and intact. No rash noted.  Patient with large laceration of the right forearm measuring total length of 8 cm.  No active bleeding is appreciated. ____________________________________________  PROCEDURES  .2060Laceration Repair  Date/Time: 05/28/2020 11:03 PM Performed by: 05/30/2020, PA-C Authorized by: Lissa Hoard, PA-C   Consent:    Consent obtained:  Verbal   Consent given by:  Patient   Risks discussed:  Pain and poor wound healing Anesthesia (see MAR for exact dosages):    Anesthesia method:  Local infiltration   Local anesthetic:  Lidocaine 2% WITH epi Laceration details:    Location:  Shoulder/arm   Shoulder/arm  location:  R lower arm   Length (cm):  10   Depth (mm):  4 Repair type:    Repair type:  Simple Pre-procedure details:    Preparation:  Patient was prepped and draped in usual sterile fashion Exploration:    Contaminated: no   Treatment:    Area cleansed with:  Betadine and saline   Amount of cleaning:  Standard   Irrigation solution:  Sterile saline   Irrigation method:  Syringe Skin repair:    Repair method:  Sutures   Suture size:  4-0   Suture material:  Nylon   Suture technique:  Simple interrupted   Number of sutures:  14 Approximation:     Approximation:  Close Post-procedure details:    Dressing:  Non-adherent dressing and bulky dressing   Patient tolerance of procedure:  Tolerated well, no immediate complications   ____________________________________________  INITIAL IMPRESSION / ASSESSMENT AND PLAN / ED COURSE  Patient with ED evaluation and management of accidental laceration to the right forearm.  Patient is wound is repaired using sutures with good wound edge approximation.  Patient is discharged with wound care instructions and supplies.  Follow-up with local urgent care for suture removal in 12 to 14 days.  [name] was evaluated in Emergency Department on 05/28/2020 for the symptoms described in the history of present illness. He was evaluated in the context of the global COVID-19 pandemic, which necessitated consideration that the patient might be at risk for infection with the SARS-CoV-2 virus that causes COVID-19. Institutional protocols and algorithms that pertain to the evaluation of patients at risk for COVID-19 are in a state of rapid change based on information released by regulatory bodies including the CDC and federal and state organizations. These policies and algorithms were followed during the patient's care in the ED. ____________________________________________  FINAL CLINICAL IMPRESSION(S) / ED DIAGNOSES  Final diagnoses:  Laceration of forehead, initial encounter      Lissa Hoard, PA-C 05/28/20 2334    Dionne Bucy, MD 05/28/20 2336

## 2020-05-30 ENCOUNTER — Other Ambulatory Visit: Payer: Self-pay

## 2020-05-30 ENCOUNTER — Ambulatory Visit
Admission: EM | Admit: 2020-05-30 | Discharge: 2020-05-30 | Disposition: A | Discharge status: Home / Self Care | Attending: Family Medicine | Admitting: Family Medicine

## 2020-05-30 DIAGNOSIS — L03113 Cellulitis of right upper limb: Secondary | ICD-10-CM

## 2020-05-30 HISTORY — DX: Scrotal varices: I86.1

## 2020-05-30 MED ORDER — DOXYCYCLINE HYCLATE 100 MG PO CAPS: 100.0000 mg | ORAL_CAPSULE | Freq: Two times a day (BID) | ORAL | 0 refills | Status: DC

## 2020-05-30 NOTE — ED Triage Notes (Signed)
Patient reports he was seen in [facility] ED on 8/28 for a laceration to the right forearm. Patient here today d/t increased swelling, erythema, and pain at the site. Redness has began to move up patient right arm. Patient reports he was not given any prophylactic abx.

## 2020-05-30 NOTE — ED Provider Notes (Signed)
Daniel Garrett    CSN: [id number] Arrival date & time: 05/30/20  1053      History   Chief Complaint Chief Complaint  Patient presents with  . Arm Pain  . Cellulitis    HPI Daniel Garrett is a 34 y.o. male.   Today with redness, swelling to right arm.  Was seen in the ER 2 days ago and had sutures placed to right arm due to laceration.  Woke up this morning with severe redness, swelling and warm to touch.  No fevers, chills, body aches, nausea.     Past Medical History:  Diagnosis Date  . Varicose vein of scrotum     There are no problems to display for this patient.   Past Surgical History:  Procedure Laterality Date  . testicular varicose vein surgery    . WISDOM TOOTH EXTRACTION    . WISDOM TOOTH EXTRACTION         Home Medications    Prior to Admission medications   Medication Sig Start Date End Date Taking? Authorizing Provider  doxycycline (VIBRAMYCIN) 100 MG capsule Take 1 capsule (100 mg total) by mouth 2 (two) times daily. 05/30/20   Janace Aris, NP    Family History History reviewed. No pertinent family history.  Social History Social History   Tobacco Use  . Smoking status: Current Every Day Smoker    Packs/day: 1.00  . Smokeless tobacco: Never Used  Vaping Use  . Vaping Use: Never used  Substance Use Topics  . Alcohol use: Yes  . Drug use: No     Allergies   Amoxicillin and Penicillins   Review of Systems Review of Systems   Physical Exam Triage Vital Signs ED Triage Vitals  Enc Vitals Group     BP 05/30/20 1119 112/70     Pulse Rate 05/30/20 1119 74     Resp 05/30/20 1119 16     Temp 05/30/20 1119 98.9 F (37.2 C)     Temp src --      SpO2 05/30/20 1119 96 %     Weight --      Height --      Head Circumference --      Peak Flow --      Pain Score 05/30/20 1114 7     Pain Loc --      Pain Edu? --      Excl. in GC? --    No data found.  Updated Vital Signs BP 112/70   Pulse 74   Temp 98.9 F  (37.2 C)   Resp 16   SpO2 96%   Visual Acuity Right Eye Distance:   Left Eye Distance:   Bilateral Distance:    Right Eye Near:   Left Eye Near:    Bilateral Near:     Physical Exam Vitals and nursing note reviewed.  Constitutional:      Appearance: Normal appearance.  HENT:     Head: Normocephalic and atraumatic.     Nose: Nose normal.  Eyes:     Conjunctiva/sclera: Conjunctivae normal.  Pulmonary:     Effort: Pulmonary effort is normal.  Musculoskeletal:        General: Normal range of motion.     Cervical back: Normal range of motion.  Skin:    General: Skin is warm and dry.     Comments: See picture for detail   Neurological:     Mental Status: He is alert.  Psychiatric:  Mood and Affect: Mood normal.          UC Treatments / Results  Labs (all labs ordered are listed, but only abnormal results are displayed) Labs Reviewed - No data to display  EKG   Radiology No results found.  Procedures Procedures (including critical care time)  Medications Ordered in UC Medications - No data to display  Initial Impression / Assessment and Plan / UC Course  I have reviewed the triage vital signs and the nursing notes.  Pertinent labs & imaging results that were available during my care of the patient were reviewed by me and considered in my medical decision making (see chart for details).     Cellulitis of right upper arm Treated with doxycycline. Marked area  Strict return precautions given  Final Clinical Impressions(s) / UC Diagnoses   Final diagnoses:  Cellulitis of right upper extremity     Discharge Instructions     Take the antibiotics as prescribed Please return or go to the ER for any worsening symptoms.     ED Prescriptions    Medication Sig Dispense Auth. Provider   doxycycline (VIBRAMYCIN) 100 MG capsule Take 1 capsule (100 mg total) by mouth 2 (two) times daily. 20 capsule Dahlia Byes A, NP     PDMP not reviewed this  encounter.   Janace Aris, NP 05/30/20 1147

## 2020-05-30 NOTE — Discharge Instructions (Addendum)
Take the antibiotics as prescribed Please return or go to the ER for any worsening symptoms.

## 2020-06-02 ENCOUNTER — Emergency Department
Admission: EM | Admit: 2020-06-02 | Discharge: 2020-06-02 | Disposition: A | Discharge status: Home / Self Care | Attending: Emergency Medicine | Admitting: Emergency Medicine

## 2020-06-02 ENCOUNTER — Other Ambulatory Visit: Payer: Self-pay

## 2020-06-02 ENCOUNTER — Encounter: Payer: Self-pay | Admitting: Emergency Medicine

## 2020-06-02 DIAGNOSIS — L03113 Cellulitis of right upper limb: Secondary | ICD-10-CM

## 2020-06-02 DIAGNOSIS — F17200 Nicotine dependence, unspecified, uncomplicated: Secondary | ICD-10-CM | POA: Diagnosis not present

## 2020-06-02 MED ORDER — CLINDAMYCIN HCL 150 MG PO CAPS
300.0000 mg | ORAL_CAPSULE | Freq: Once | ORAL | Status: AC
  Administered 2020-06-02: 300 mg via ORAL
  Filled 2020-06-02: qty 2

## 2020-06-02 MED ORDER — CLINDAMYCIN HCL 300 MG PO CAPS: 300.0000 mg | ORAL_CAPSULE | Freq: Three times a day (TID) | ORAL | 0 refills | Status: AC

## 2020-06-02 MED ORDER — HYDROCODONE-ACETAMINOPHEN 5-325 MG PO TABS: 1.0000 | ORAL_TABLET | Freq: Four times a day (QID) | ORAL | 0 refills | Status: AC | PRN

## 2020-06-02 NOTE — ED Notes (Signed)
Pt unable to sign E-signature due to signature pad malfunction. Pt verbalized understanding of d/c instructions and had no additional questions or concerns for this RN or provider. Pt left with d/c instructions and gathered all personal belongings from room and removed them prior to ED departure.   

## 2020-06-02 NOTE — ED Triage Notes (Signed)
Pt presents to ED today for a wound check r/t to a laceration to the right forearm. On 8/28 Pt reports coming here to ED and getting 14 stitches and st that he was not given preventive abx initially (no redness was noted initially per pt.) Then pt went to the urgent care and was given 100mg  of doxycyline on 8/30 due to the redness/swelling and drainage  (the border of the swelling was noted via skin pen).   Pt A&Ox4 and in NAD at this time. Pt denies any issues with mobility or function to arm.

## 2020-06-02 NOTE — Discharge Instructions (Addendum)
Continue dressing changes as you have been doing the past several days.  Use warm compress over the area off and on throughout the day.

## 2020-06-02 NOTE — ED Provider Notes (Signed)
[facility] Emergency Department Provider Note  ____________________________________________  Time seen: Approximately 9:31 PM  I have reviewed the triage vital signs and the nursing notes.   HISTORY  Chief Complaint No chief complaint on file.   HPI DIRCK BUTCH is a 34 y.o. male presents to the emergency department for wound check.   He sustained a laceration to his forearm on 05/28/2020 and was evaluated here.  Sutures were inserted.  He had been taking doxycycline after developing a cellulitis around the area.  Erythematous area was marked and has gone down but the area surrounded the small sutured area is now raised and "puffy."  It started draining this afternoon.  Past Medical History:  Diagnosis Date  . Varicose vein of scrotum     There are no problems to display for this patient.   Past Surgical History:  Procedure Laterality Date  . testicular varicose vein surgery    . WISDOM TOOTH EXTRACTION    . WISDOM TOOTH EXTRACTION      Prior to Admission medications   Medication Sig Start Date End Date Taking? Authorizing Provider  clindamycin (CLEOCIN) 300 MG capsule Take 1 capsule (300 mg total) by mouth 3 (three) times daily for 10 days. 06/02/20 06/12/20  Korina Tretter, Rulon Eisenmenger B, FNP  HYDROcodone-acetaminophen (NORCO/VICODIN) 5-325 MG tablet Take 1 tablet by mouth every 6 (six) hours as needed for up to 3 days for severe pain. 06/02/20 06/05/20  Kem Boroughs B, FNP    Allergies Amoxicillin and Penicillins  History reviewed. No pertinent family history.  Social History Social History   Tobacco Use  . Smoking status: Current Every Day Smoker    Packs/day: 1.00  . Smokeless tobacco: Never Used  Vaping Use  . Vaping Use: Never used  Substance Use Topics  . Alcohol use: Yes  . Drug use: No    Review of Systems  Constitutional: Negative for fever. Respiratory: Negative for cough or shortness of breath.  Musculoskeletal: Negative for  myalgias Skin: Positive for erythema and sutures over the right forearm. Neurological: Negative for numbness or paresthesias. ____________________________________________   PHYSICAL EXAM:  VITAL SIGNS: ED Triage Vitals  Enc Vitals Group     BP 06/02/20 2049 127/73     Pulse Rate 06/02/20 2049 66     Resp 06/02/20 2049 20     Temp 06/02/20 2049 98.2 F (36.8 C)     Temp Source 06/02/20 2049 Oral     SpO2 06/02/20 2049 99 %     Weight 06/02/20 2049 178 lb (80.7 kg)     Height 06/02/20 2120 5\' 9"  (1.753 m)     Head Circumference --      Peak Flow --      Pain Score 06/02/20 2120 5     Pain Loc --      Pain Edu? --      Excl. in GC? --      Constitutional: Overall well appearing. Eyes: Conjunctivae are clear without discharge or drainage. Nose: No rhinorrhea noted. Mouth/Throat: Airway is patent.  Neck: No stridor. Unrestricted range of motion observed. Cardiovascular: Capillary refill is <3 seconds.  Respiratory: Respirations are even and unlabored.. Musculoskeletal: Unrestricted range of motion observed. Neurologic: Awake, alert, and oriented x 4.  Skin: 10 cm sutured laceration overlying the volar aspect of the right arm is healing well without evidence of infection.  Second laceration with 1 suture draining serosanguineous fluid.  ____________________________________________   LABS (all labs ordered are listed, but  only abnormal results are displayed)  Labs Reviewed - No data to display ____________________________________________  EKG  Not indicated. ____________________________________________  RADIOLOGY  Not indicated ____________________________________________   PROCEDURES  Procedures ____________________________________________   INITIAL IMPRESSION / ASSESSMENT AND PLAN / ED COURSE  MAKARIOS MADLOCK is a 34 y.o. male presenting to the emergency department for treatment and evaluation of erythema overlying sutured area on the right  forearm.  Suture was removed with return of moderate amount of serosanguineous  and occasionally purulent drainage. Patient reported near immediate decrease in pain after the area was allowed to drain. He will be discharged home with clindamycin and a few norco. He is to use warm compresses over the area off and on throughout the day as well. He is to follow up with PCP or return to the ER for symptoms that change or worsen or for new concerns.  Medications  clindamycin (CLEOCIN) capsule 300 mg (300 mg Oral Given 06/02/20 2133)     Pertinent labs & imaging results that were available during my care of the patient were reviewed by me and considered in my medical decision making (see chart for details).  ____________________________________________   FINAL CLINICAL IMPRESSION(S) / ED DIAGNOSES  Final diagnoses:  Cellulitis of right upper extremity    ED Discharge Orders         Ordered    clindamycin (CLEOCIN) 300 MG capsule  3 times daily        06/02/20 2139    HYDROcodone-acetaminophen (NORCO/VICODIN) 5-325 MG tablet  Every 6 hours PRN        06/02/20 2139           Note:  This document was prepared using Dragon voice recognition software and may include unintentional dictation errors.   Chinita Pester, FNP 06/02/20 2340    Arnaldo Natal, MD 06/02/20 2352

## 2020-06-02 NOTE — ED Notes (Signed)
Pt's arm was re-wrapped with a ABD pad and secured with Coban wrapping.

## 2020-06-12 ENCOUNTER — Emergency Department
Admission: EM | Admit: 2020-06-12 | Discharge: 2020-06-12 | Disposition: A | Discharge status: Home / Self Care | Attending: Emergency Medicine | Admitting: Emergency Medicine

## 2020-06-12 ENCOUNTER — Other Ambulatory Visit: Payer: Self-pay

## 2020-06-12 ENCOUNTER — Encounter: Payer: Self-pay | Admitting: Emergency Medicine

## 2020-06-12 DIAGNOSIS — Z4802 Encounter for removal of sutures: Secondary | ICD-10-CM | POA: Insufficient documentation

## 2020-06-12 DIAGNOSIS — F17200 Nicotine dependence, unspecified, uncomplicated: Secondary | ICD-10-CM | POA: Insufficient documentation

## 2020-06-12 DIAGNOSIS — S41111D Laceration without foreign body of right upper arm, subsequent encounter: Secondary | ICD-10-CM | POA: Insufficient documentation

## 2020-06-12 DIAGNOSIS — X58XXXD Exposure to other specified factors, subsequent encounter: Secondary | ICD-10-CM | POA: Diagnosis not present

## 2020-06-12 NOTE — Discharge Instructions (Addendum)
Continue to keep area clean and dry.  Clean daily with mild soap and water.  Follow-up with your primary care provider or [facility] acute care if any continued problems.  If any severe worsening of your symptoms such as fever, chills or pus from the area return to the emergency department.

## 2020-06-12 NOTE — ED Provider Notes (Signed)
[facility] Emergency Department Provider Note   ____________________________________________   First MD Initiated Contact with Patient 06/12/20 1048     (approximate)  I have reviewed the triage vital signs and the nursing notes.   HISTORY  Chief Complaint Suture / Staple Removal   HPI Daniel Garrett is a 34 y.o. male was seen in the emergency department for laceration on 05/28/2020.  Patient developed cellulitis in his right arm and was seen in the emergency department on 06/02/2020.  Patient denies any drainage from the area at this time.       Past Medical History:  Diagnosis Date  . Varicose vein of scrotum     There are no problems to display for this patient.   Past Surgical History:  Procedure Laterality Date  . testicular varicose vein surgery    . WISDOM TOOTH EXTRACTION    . WISDOM TOOTH EXTRACTION      Prior to Admission medications   Medication Sig Start Date End Date Taking? Authorizing Provider  clindamycin (CLEOCIN) 300 MG capsule Take 1 capsule (300 mg total) by mouth 3 (three) times daily for 10 days. 06/02/20 06/12/20  Kem Boroughs B, FNP    Allergies Amoxicillin and Penicillins  History reviewed. No pertinent family history.  Social History Social History   Tobacco Use  . Smoking status: Current Every Day Smoker    Packs/day: 1.00  . Smokeless tobacco: Never Used  Vaping Use  . Vaping Use: Never used  Substance Use Topics  . Alcohol use: Yes  . Drug use: No    Review of Systems Constitutional: No fever/chills Cardiovascular: Denies chest pain. Respiratory: Denies shortness of breath. Musculoskeletal: Negative for muscle aches. Skin: Healed laceration. Neurological: Negative for headaches, focal weakness or numbness.  ____________________________________________   PHYSICAL EXAM:  VITAL SIGNS: ED Triage Vitals  Enc Vitals Group     BP 06/12/20 0950 126/63     Pulse Rate 06/12/20 0950 (!) 55      Resp 06/12/20 0950 17     Temp 06/12/20 0950 98.7 F (37.1 C)     Temp Source 06/12/20 0950 Oral     SpO2 06/12/20 0950 98 %     Weight 06/12/20 0946 178 lb (80.7 kg)     Height 06/12/20 0946 5\' 9"  (1.753 m)     Head Circumference --      Peak Flow --      Pain Score 06/12/20 0946 0     Pain Loc --      Pain Edu? --      Excl. in GC? --     Constitutional: Alert and oriented. Well appearing and in no acute distress. Eyes: Conjunctivae are normal.  Head: Atraumatic. Neck: No stridor.   Cardiovascular: Normal rate, regular rhythm. Grossly normal heart sounds.  Good peripheral circulation. Respiratory: Normal respiratory effort.  No retractions. Lungs CTAB. Musculoskeletal: Moves right forearm without any difficulty. Neurologic:  Normal speech and language. No gross focal neurologic deficits are appreciated. No gait instability. Skin:  Skin is warm, dry and intact. Well-healed surgical scar without any evidence of infection. Psychiatric: Mood and affect are normal. Speech and behavior are normal.  ____________________________________________   LABS (all labs ordered are listed, but only abnormal results are displayed)  Labs Reviewed - No data to display  PROCEDURES  Procedure(s) performed (including Critical Care):  Procedures   ____________________________________________   INITIAL IMPRESSION / ASSESSMENT AND PLAN / ED COURSE  As part of my  medical decision making, I reviewed the following data within the electronic MEDICAL RECORD NUMBER Notes from prior ED visits and [place] Controlled Substance Database  34 year old male presents to the ED for suture removal. Patient was seen in the emergency department at which time 14 sutures were placed in his right forearm. He denies any problems at this time. Area appears to be healed without any evidence of infection. Sutures were removed and patient was discharged.  ____________________________________________   FINAL CLINICAL  IMPRESSION(S) / ED DIAGNOSES  Final diagnoses:  Encounter for removal of sutures     ED Discharge Orders    None      *Please note:  HOMER MILLER was evaluated in Emergency Department on 06/12/2020 for the symptoms described in the history of present illness. He was evaluated in the context of the global COVID-19 pandemic, which necessitated consideration that the patient might be at risk for infection with the SARS-CoV-2 virus that causes COVID-19. Institutional protocols and algorithms that pertain to the evaluation of patients at risk for COVID-19 are in a state of rapid change based on information released by regulatory bodies including the CDC and federal and state organizations. These policies and algorithms were followed during the patient's care in the ED.  Some ED evaluations and interventions may be delayed as a result of limited staffing during and the pandemic.*   Note:  This document was prepared using Dragon voice recognition software and may include unintentional dictation errors.    Tommi Rumps, PA-C 06/12/20 1501    Dionne Bucy, MD 06/12/20 [phone]

## 2020-06-12 NOTE — ED Triage Notes (Signed)
Pt presents to ED via POV with c/o needing 14 stitches removed from R forearm. Pt states was seen approx 2 weeks ago and having them placed.

## 2023-05-02 ENCOUNTER — Encounter: Payer: Self-pay | Admitting: Physician Assistant

## 2023-05-02 ENCOUNTER — Ambulatory Visit: Admitting: Physician Assistant

## 2023-05-02 VITALS — BP 116/68 | HR 64 | Temp 98.5°F | Resp 20 | Ht 69.0 in | Wt 204.0 lb

## 2023-05-02 DIAGNOSIS — R3129 Other microscopic hematuria: Secondary | ICD-10-CM | POA: Diagnosis not present

## 2023-05-02 DIAGNOSIS — R748 Abnormal levels of other serum enzymes: Secondary | ICD-10-CM | POA: Insufficient documentation

## 2023-05-02 DIAGNOSIS — R0683 Snoring: Secondary | ICD-10-CM

## 2023-05-02 DIAGNOSIS — R82998 Other abnormal findings in urine: Secondary | ICD-10-CM | POA: Diagnosis not present

## 2023-05-02 DIAGNOSIS — F1910 Other psychoactive substance abuse, uncomplicated: Secondary | ICD-10-CM | POA: Diagnosis not present

## 2023-05-02 DIAGNOSIS — Z7289 Other problems related to lifestyle: Secondary | ICD-10-CM

## 2023-05-02 DIAGNOSIS — R0681 Apnea, not elsewhere classified: Secondary | ICD-10-CM

## 2023-05-02 NOTE — Assessment & Plan Note (Signed)
Patient reports symptoms highly suggestive of sleep apnea, including morning headaches, daytime sleepiness, and waking up gasping for air. -Refer to pulmonology for sleep study.

## 2023-05-02 NOTE — Assessment & Plan Note (Signed)
-  Repeat liver function tests, add ggt, CK, uacr, urine micro

## 2023-05-02 NOTE — Assessment & Plan Note (Signed)
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  Elevated liver enzymes noted on recent blood work -Discontinue oral steroids. Pt honestly answers that once his labs are normal, he likely will continue use. I recommend against this

## 2023-05-02 NOTE — Assessment & Plan Note (Signed)
Patient reports use of nicotine vape, previously smoked cigarettes for 10 years. -Encourage continued abstinence from cigarettes. -Discuss potential health risks of vaping and strategies for nicotine cessation.

## 2023-05-02 NOTE — Progress Notes (Signed)
New patient visit   Patient: Daniel Garrett   DOB: 07/04/1986   37 y.o. Male  MRN: [id number] Visit Date: 05/02/2023  Today's healthcare provider: Alfredia Ferguson, PA-C   Chief Complaint  Patient presents with   Labs Only    Review labs from recent UC visit ([facility])   Snoring   Headache    Referral for sleep study    Subjective    Daniel Garrett is a 37 y.o. male who presents today as a new patient to establish care.  HPI Discussed the use of AI scribe software for clinical note transcription with the patient, who gave verbal consent to proceed.  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 The patient reports that these symptoms began about two weeks ago, following a period of intense physical activity and inadequate hydration. Despite maintaining his usual water intake, the patient believes he was not consuming enough to compensate for the increased physical exertion and the effects of the supplements he was taking.  The patient has been using various supplements, including steroids and HGH for about a year, following a cycle-on, cycle-off pattern. He reports that he stopped using these substances about two weeks ago when he started feeling unwell. The patient also has a history of smoking and vaping, but he has not consumed alcohol for over a year. He presented to urgent care 7/20 with body aches, fatigue, headaches. Labs were drawn, liver enzymes were elevated. Pt has stopped all steroid use since then and feels improved. He has not had a regular pcp and wants to ensure he is safe while taking these substances.    . He also reports frequent headaches, which he usually manages with Tylenol, and has been experiencing sleep disturbances--snoring, witnessed apena, which he believes may be due to sleep apnea.      Pt also has history of  high caffiene intake, which he stopped two weeks ago as well. Prior to this time, he was consuming ~ 600 mg of caffiene daily with energy drinks . Denies any etoh consumption. Reports prn tylenol use for AM headaches but no more than 2 in a single day.     05/02/2023    3:35 PM  Results of the Epworth flowsheet  Sitting and reading 3  Watching TV 3  Sitting, inactive in a public place (e.g. a theatre or a meeting) 1  As a passenger in a car for an hour without a break 3  Lying down to rest in the afternoon when circumstances permit 3  Sitting and talking to someone 0  Sitting quietly after a lunch without alcohol 1  In a car, while stopped for a few minutes in traffic 0  Total score 14    Past Medical History:  Diagnosis Date   Varicose vein of scrotum    Past Surgical History:  Procedure Laterality Date   testicular varicose vein surgery     WISDOM TOOTH EXTRACTION     WISDOM TOOTH EXTRACTION     No family status information on file.   History reviewed. No pertinent family history. Social History   Socioeconomic History   Marital status: Married    Spouse name: Not on file   Number of children: Not on file   Years of education: Not on file   Highest education level: Not on file  Occupational History   Not  on file  Tobacco Use   Smoking status: Every Day    Current packs/day: 1.00    Types: Cigarettes   Smokeless tobacco: Never  Vaping Use   Vaping status: Never Used  Substance and Sexual Activity   Alcohol use: Yes   Drug use: No   Sexual activity: Not on file  Other Topics Concern   Not on file  Social History Narrative   Not on file   Social Determinants of Health   Financial Resource Strain: Not on file  Food Insecurity: Not on file  Transportation Needs: Not on file  Physical Activity: Not on file  Stress: Not on file  Social Connections: Not on file   No outpatient medications prior to visit.   No facility-administered medications prior to visit.    Allergies  Allergen Reactions   Amoxicillin Hives   Penicillins Hives     There is no immunization history on file for this patient.  Health Maintenance  Topic Date Due   HIV Screening  Never done   Hepatitis C Screening  Never done   DTaP/Tdap/Td (1 - Tdap) Never done   COVID-19 Vaccine (1 - 2023-24 season) Never done   INFLUENZA VACCINE  05/02/2023   HPV VACCINES  Aged Out    Patient Care Team: Alfredia Ferguson, PA-C as PCP - General (Physician Assistant)    Objective    BP 116/68   Pulse 64   Temp 98.5 F (36.9 C) (Oral)   Resp 20   Ht 5\' 9"  (1.753 m)   Wt 204 lb (92.5 kg)   SpO2 98%   BMI 30.13 kg/m    Physical Exam Constitutional:      General: He is awake.     Appearance: He is well-developed.  HENT:     Head: Normocephalic.  Eyes:     Conjunctiva/sclera: Conjunctivae normal.  Cardiovascular:     Rate and Rhythm: Normal rate and regular rhythm.     Heart sounds: Normal heart sounds.  Pulmonary:     Effort: Pulmonary effort is normal.     Breath sounds: Normal breath sounds.  Skin:    General: Skin is warm.  Neurological:     Mental Status: He is alert and oriented to person, place, and time.  Psychiatric:        Attention and Perception: Attention normal.        Mood and Affect: Mood normal.        Speech: Speech normal.        Behavior: Behavior is cooperative.    Depression Screen    05/02/2023    3:26 PM  PHQ 2/9 Scores  PHQ - 2 Score 0   No results found for any visits on 05/02/23.  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  Elevated liver enzymes noted on recent blood work -Discontinue oral steroids. Pt honestly answers that once his labs are normal, he likely will continue use. I recommend against this       Witnessed episode of apnea    Patient reports symptoms highly suggestive of sleep apnea, including morning headaches, daytime sleepiness, and waking up gasping for air. -Refer to pulmonology for sleep study.      Relevant Orders  Ambulatory referral to Pulmonology   Elevated liver enzymes - Primary    -Repeat liver function tests, add ggt, CK, uacr, urine micro      Relevant Orders   Comp Met (CMET)   Gamma GT   CK (Creatine Kinase)   Urine Microalbumin w/creat. ratio   Current every day vaping    Patient reports use of nicotine vape, previously smoked cigarettes for 10 years. -Encourage continued abstinence from cigarettes. -Discuss potential health risks of vaping and strategies for nicotine cessation.       Other Visit Diagnoses     Dark urine       Relevant Orders   CK (Creatine Kinase)   Urine Microalbumin w/creat. ratio   Snoring       Relevant Orders   Ambulatory referral to Pulmonology   Microscopic hematuria       Relevant Orders   Urine Microscopic        Return if symptoms worsen or fail to improve.   Will contact w/ lab results  I, Alfredia Ferguson, PA-C have reviewed all documentation for this visit. The documentation on  05/02/23   for the exam, diagnosis, procedures, and orders are all accurate and complete.    Alfredia Ferguson, PA-C  [facility] at [facility] [phone] (phone) [phone] (fax)  [facility]

## 2023-06-17 ENCOUNTER — Ambulatory Visit (INDEPENDENT_AMBULATORY_CARE_PROVIDER_SITE_OTHER): Admitting: Adult Health

## 2023-06-17 ENCOUNTER — Encounter: Payer: Self-pay | Admitting: Adult Health

## 2023-06-17 VITALS — BP 102/62 | HR 65 | Ht 69.0 in | Wt 192.0 lb

## 2023-06-17 DIAGNOSIS — R0683 Snoring: Secondary | ICD-10-CM

## 2023-06-17 NOTE — Patient Instructions (Signed)
Set up for home sleep study  Work on healthy sleep regimen  Do not drive if sleepy Follow up in 6 weeks to discuss results and treatment plan. (Friday virtual clinic) .

## 2023-06-17 NOTE — Progress Notes (Unsigned)
@Patient  ID: Daniel Garrett, male    DOB: [birth year], 37 y.o.   MRN: [id number]  Chief Complaint  Patient presents with   Consult    Referring provider: Alfredia Ferguson, PA-C  HPI:   TEST/EVENTS :   06/17/2023     Allergies  Allergen Reactions   Amoxicillin Hives   Penicillins Hives     There is no immunization history on file for this patient.  Past Medical History:  Diagnosis Date   Varicose vein of scrotum     Tobacco History: Social History   Tobacco Use  Smoking Status Every Day   Current packs/day: 1.00   Types: Cigarettes  Smokeless Tobacco Never   Ready to quit: Not Answered Counseling given: Not Answered   No outpatient medications prior to visit.   No facility-administered medications prior to visit.     Review of Systems:   Constitutional:   No  weight loss, night sweats,  Fevers, chills, fatigue, or  lassitude.  HEENT:   No headaches,  Difficulty swallowing,  Tooth/dental problems, or  Sore throat,                No sneezing, itching, ear ache, nasal congestion, post nasal drip,   CV:  No chest pain,  Orthopnea, PND, swelling in lower extremities, anasarca, dizziness, palpitations, syncope.   GI  No heartburn, indigestion, abdominal pain, nausea, vomiting, diarrhea, change in bowel habits, loss of appetite, bloody stools.   Resp: No shortness of breath with exertion or at rest.  No excess mucus, no productive cough,  No non-productive cough,  No coughing up of blood.  No change in color of mucus.  No wheezing.  No chest wall deformity  Skin: no rash or lesions.  GU: no dysuria, change in color of urine, no urgency or frequency.  No flank pain, no hematuria   MS:  No joint pain or swelling.  No decreased range of motion.  No back pain.    Physical Exam  BP 102/62   Pulse 65   Ht 5\' 9"  (1.753 m)   Wt 192 lb (87.1 kg)   SpO2 99%   BMI 28.35 kg/m   GEN: A/Ox3; pleasant , NAD, well nourished    HEENT:  [place]/AT,  EACs-clear,  TMs-wnl, NOSE-clear, THROAT-clear, no lesions, no postnasal drip or exudate noted.   NECK:  Supple w/ fair ROM; no JVD; normal carotid impulses w/o bruits; no thyromegaly or nodules palpated; no lymphadenopathy.    RESP  Clear  P & A; w/o, wheezes/ rales/ or rhonchi. no accessory muscle use, no dullness to percussion  CARD:  RRR, no m/r/g, no peripheral edema, pulses intact, no cyanosis or clubbing.  GI:   Soft & nt; nml bowel sounds; no organomegaly or masses detected.   Musco: Warm bil, no deformities or joint swelling noted.   Neuro: alert, no focal deficits noted.    Skin: Warm, no lesions or rashes    Lab Results:  CBC No results found for: "WBC", "RBC", "HGB", "HCT", "PLT", "MCV", "MCH", "MCHC", "RDW", "LYMPHSABS", "MONOABS", "EOSABS", "BASOSABS"  BMET    Component Value Date/Time   NA 137 05/02/2023 1612   K 4.0 05/02/2023 1612   CL 99 05/02/2023 1612   CO2 27 05/02/2023 1612   GLUCOSE 84 05/02/2023 1612   BUN 26 (H) 05/02/2023 1612   CREATININE 1.36 05/02/2023 1612   CALCIUM 9.9 05/02/2023 1612    BNP No results found for: "BNP"  ProBNP No results found  for: "PROBNP"  Imaging: No results found.  Administration History     None           No data to display          No results found for: "NITRICOXIDE"      Assessment & Plan:   No problem-specific Assessment & Plan notes found for this encounter.     Rubye Oaks, NP 06/17/2023

## 2023-06-18 DIAGNOSIS — R0683 Snoring: Secondary | ICD-10-CM | POA: Insufficient documentation

## 2023-06-18 NOTE — Assessment & Plan Note (Addendum)
Loud snoring, witnessed apneic events, gasping for air and choking during sleep, headaches, daytime sleepiness teeth grinding and teeth clenching all suspicious for underlying sleep apnea.  Will set patient up for home sleep study.  Patient education given on sleep apnea - discussed how weight can impact sleep and risk for sleep disordered breathing - discussed options to assist with weight loss: combination of diet modification, cardiovascular and strength training exercises   - had an extensive discussion regarding the adverse health consequences related to untreated sleep disordered breathing - specifically discussed the risks for hypertension, coronary artery disease, cardiac dysrhythmias, cerebrovascular disease, and diabetes - lifestyle modification discussed   - discussed how sleep disruption can increase risk of accidents, particularly when driving - safe driving practices were discussed     Plan  Patient Instructions  Set up for home sleep study  Work on healthy sleep regimen  Do not drive if sleepy Follow up in 6 weeks to discuss results and treatment plan. (Friday virtual clinic) .

## 2023-06-18 NOTE — Progress Notes (Signed)
Reviewed and agree with assessment/plan.   Coralyn Helling, MD [facility] Pulmonary/Critical Care 06/18/2023, 1:31 PM Pager:  [phone]

## 2023-08-05 ENCOUNTER — Other Ambulatory Visit: Payer: Self-pay | Admitting: Physician Assistant

## 2023-08-05 ENCOUNTER — Telehealth: Payer: Self-pay | Admitting: Physician Assistant

## 2023-08-05 ENCOUNTER — Ambulatory Visit (INDEPENDENT_AMBULATORY_CARE_PROVIDER_SITE_OTHER): Admitting: Physician Assistant

## 2023-08-05 ENCOUNTER — Encounter: Payer: Self-pay | Admitting: Physician Assistant

## 2023-08-05 VITALS — BP 128/82 | HR 68 | Temp 98.2°F | Ht 69.0 in | Wt 189.0 lb

## 2023-08-05 DIAGNOSIS — M6282 Rhabdomyolysis: Secondary | ICD-10-CM

## 2023-08-05 DIAGNOSIS — M79632 Pain in left forearm: Secondary | ICD-10-CM | POA: Diagnosis not present

## 2023-08-05 DIAGNOSIS — F1910 Other psychoactive substance abuse, uncomplicated: Secondary | ICD-10-CM

## 2023-08-05 DIAGNOSIS — Z0000 Encounter for general adult medical examination without abnormal findings: Secondary | ICD-10-CM

## 2023-08-05 DIAGNOSIS — R748 Abnormal levels of other serum enzymes: Secondary | ICD-10-CM

## 2023-08-05 LAB — COMPREHENSIVE METABOLIC PANEL
ALT: 72 U/L — ABNORMAL HIGH (ref 0–53)
AST: 183 U/L — ABNORMAL HIGH (ref 0–37)
Albumin: 4.3 g/dL (ref 3.5–5.2)
Alkaline Phosphatase: 67 U/L (ref 39–117)
BUN: 17 mg/dL (ref 6–23)
CO2: 28 meq/L (ref 19–32)
Calcium: 9.4 mg/dL (ref 8.4–10.5)
Chloride: 105 meq/L (ref 96–112)
Creatinine, Ser: 0.99 mg/dL (ref 0.40–1.50)
GFR: 96.78 mL/min (ref >60.00)
Glucose, Bld: 95 mg/dL (ref 70–99)
Potassium: 4.5 meq/L (ref 3.5–5.1)
Sodium: 141 meq/L (ref 135–145)
Total Bilirubin: 0.4 mg/dL (ref 0.2–1.2)
Total Protein: 7.3 g/dL (ref 6.0–8.3)

## 2023-08-05 LAB — CBC WITH DIFFERENTIAL/PLATELET
Basophils Absolute: 0 10*3/uL (ref 0.0–0.1)
Basophils Relative: 0.8 % (ref 0.0–3.0)
Eosinophils Absolute: 0.2 10*3/uL (ref 0.0–0.7)
Eosinophils Relative: 4.5 % (ref 0.0–5.0)
HCT: 44.8 % (ref 39.0–52.0)
Hemoglobin: 15.1 g/dL (ref 13.0–17.0)
Lymphocytes Relative: 35.3 % (ref 12.0–46.0)
Lymphs Abs: 1.8 10*3/uL (ref 0.7–4.0)
MCHC: 33.6 g/dL (ref 30.0–36.0)
MCV: 89.3 fL (ref 78.0–100.0)
Monocytes Absolute: 0.4 10*3/uL (ref 0.1–1.0)
Monocytes Relative: 8.5 % (ref 3.0–12.0)
Neutro Abs: 2.7 10*3/uL (ref 1.4–7.7)
Neutrophils Relative %: 50.9 % (ref 43.0–77.0)
Platelets: 244 10*3/uL (ref 150.0–400.0)
RBC: 5.01 Mil/uL (ref 4.22–5.81)
RDW: 13.7 % (ref 11.5–15.5)
WBC: 5.2 10*3/uL (ref 4.0–10.5)

## 2023-08-05 LAB — LIPID PANEL
Cholesterol: 169 mg/dL (ref 0–200)
HDL: 47.1 mg/dL (ref >39.00)
LDL Cholesterol: 110 mg/dL — ABNORMAL HIGH (ref 0–99)
NonHDL: 122.2
Total CHOL/HDL Ratio: 4
Triglycerides: 62 mg/dL (ref 0.0–149.0)
VLDL: 12.4 mg/dL (ref 0.0–40.0)

## 2023-08-05 LAB — CK: Total CK: 10353 U/L — ABNORMAL HIGH (ref 7–232)

## 2023-08-05 NOTE — Progress Notes (Signed)
Complete physical exam   Patient: Daniel Garrett   DOB: 1985/11/21   37 y.o. Male  MRN: [id number] Visit Date: 08/05/2023  Today's healthcare provider: Alfredia Ferguson, PA-C   Chief Complaint  Patient presents with   Annual Exam    Patient here for CPE- is fasting Things have been well- soreness in forearm.   Subjective    Daniel Garrett is a 37 y.o. male who presents today for a complete physical exam.  He has some concerns today over left elbow/forearm pain.  This pain occurs never he is gripping or lifting anything.  Reports he has not really taken significant time off the sides not lifting for about the last week.  But he does "race 4 wheelers" and use that arm frequently.  Past Medical History:  Diagnosis Date   Varicose vein of scrotum    Past Surgical History:  Procedure Laterality Date   testicular varicose vein surgery     WISDOM TOOTH EXTRACTION     WISDOM TOOTH EXTRACTION     Social History   Socioeconomic History   Marital status: Married    Spouse name: Not on file   Number of children: Not on file   Years of education: Not on file   Highest education level: Not on file  Occupational History   Not on file  Tobacco Use   Smoking status: Every Day    Current packs/day: 1.00    Types: Cigarettes   Smokeless tobacco: Never  Vaping Use   Vaping status: Never Used  Substance and Sexual Activity   Alcohol use: Yes   Drug use: No   Sexual activity: Not on file  Other Topics Concern   Not on file  Social History Narrative   Not on file   Social Determinants of Health   Financial Resource Strain: Not on file  Food Insecurity: Not on file  Transportation Needs: Not on file  Physical Activity: Not on file  Stress: Not on file  Social Connections: Not on file  Intimate Partner Violence: Not on file   No family status information on file.   History reviewed. No pertinent family history. Allergies  Allergen Reactions   Amoxicillin Hives    Penicillins Hives    Patient Care Team: Alfredia Ferguson, PA-C as PCP - General (Physician Assistant)   Medications: No outpatient medications prior to visit.   No facility-administered medications prior to visit.   Review of Systems  Constitutional:  Negative for fatigue and fever.  Respiratory:  Negative for cough and shortness of breath.   Cardiovascular:  Negative for chest pain, palpitations and leg swelling.  Genitourinary:  Positive for frequency.  Musculoskeletal:  Positive for myalgias.  Neurological:  Negative for dizziness and headaches.      Objective    BP 128/82   Pulse 68   Temp 98.2 F (36.8 C)   Ht 5\' 9"  (1.753 m)   Wt 189 lb (85.7 kg)   SpO2 99%   BMI 27.91 kg/m    Physical Exam Constitutional:      General: He is awake.     Appearance: He is well-developed.  HENT:     Head: Normocephalic.     Right Ear: Tympanic membrane, ear canal and external ear normal.     Left Ear: Tympanic membrane, ear canal and external ear normal.     Nose: Nose normal. No congestion or rhinorrhea.     Mouth/Throat:     Mouth: Mucous membranes are  moist.     Pharynx: No oropharyngeal exudate or posterior oropharyngeal erythema.  Eyes:     Pupils: Pupils are equal, round, and reactive to light.  Cardiovascular:     Rate and Rhythm: Normal rate and regular rhythm.     Heart sounds: Normal heart sounds.  Pulmonary:     Effort: Pulmonary effort is normal.     Breath sounds: Normal breath sounds.  Abdominal:     General: There is no distension.     Palpations: Abdomen is soft.     Tenderness: There is no abdominal tenderness. There is no guarding.  Musculoskeletal:     Cervical back: Normal range of motion.     Right lower leg: No edema.     Left lower leg: No edema.     Comments: Left upper forearm there is visible swelling and tenderness along the upper brachial radialis.  No erythema  Lymphadenopathy:     Cervical: No cervical adenopathy.  Skin:    General:  Skin is warm.  Neurological:     Mental Status: He is alert and oriented to person, place, and time.  Psychiatric:        Attention and Perception: Attention normal.        Mood and Affect: Mood normal.        Speech: Speech normal.        Behavior: Behavior normal. Behavior is cooperative.      Last depression screening scores    08/05/2023    8:18 AM 05/02/2023    3:26 PM  PHQ 2/9 Scores  PHQ - 2 Score 0 0   Last fall risk screening    08/05/2023    8:18 AM  Fall Risk   Falls in the past year? 0  Number falls in past yr: 0  Injury with Fall? 0  Risk for fall due to : No Fall Risks   Last Audit-C alcohol use screening     No data to display         A score of 3 or more in women, and 4 or more in men indicates increased risk for alcohol abuse, EXCEPT if all of the points are from question 1   No results found for any visits on 08/05/23.  Assessment & Plan    Routine Health Maintenance and Physical Exam  Exercise Activities and Dietary recommendation --balanced diet high in fiber and protein, low in sugars, carbs, fats. --physical activity/exercise 20-30 minutes 3-5 times a week    Immunization History  Administered Date(s) Administered   Anthrax 04/12/2005, 01/18/2006, 02/07/2006, 02/21/2006   Hep A / Hep B 11/22/2004, 01/09/2005   Hepatitis A, Adult 05/23/2005   Hepatitis B, ADULT 05/23/2005   IPV 11/22/2004   Influenza Nasal 11/22/2004, 07/23/2005, 06/18/2007, 07/04/2009   MMR 11/22/2004   Meningococcal Conjugate 12/18/2004   PPD Test 06/13/2009   Smallpox 04/10/2005   Td 11/22/2004   Typhoid Inactivated 04/10/2005    Health Maintenance  Topic Date Due   HIV Screening  Never done   Hepatitis C Screening  Never done   DTaP/Tdap/Td (2 - Tdap) 11/22/2014   INFLUENZA VACCINE  05/02/2023   COVID-19 Vaccine (1 - 2023-24 season) Never done   HPV VACCINES  Aged Out   Discussed health benefits of physical activity, and encouraged him to engage in regular  exercise appropriate for his age and condition. 1. Annual physical exam - Comp Met (CMET) - CBC w/Diff - Lipid panel  2. Elevated liver enzymes3.  Elevated CK Given history of elevation and history of anabolic steroid use will repeat today - Comp Met (CMET) - CK (Creatine Kinase)  4. Left forearm pain Advised patient this is from overuse and he needs to rest that muscle recommend avoiding any exercises or activities that exacerbate his pain.  Recommending rest and ice/heat prn.  5. Performance enhancing drug abuse (HCC) Lengthy conversation today on how these drugs are not recommended and are not healthy or regulated. Patient is aware what they have previously done to his body.  Will repeat some labs today if patient chooses to continue against medical advice would continue to monitor liver function, CK and kidney function.  Return in about 1 year (around 08/04/2024) for CPE.     Alfredia Ferguson, PA-C  [facility] [phone] (phone) [phone] (fax)  [facility]

## 2023-08-05 NOTE — Telephone Encounter (Signed)
Pt called and stated that he just spoke with Lillia Abed and requested to speak with her again because he has more questions and concerns regarding his blood work. Please call and advise pt.

## 2023-08-05 NOTE — Assessment & Plan Note (Signed)
Lengthy conversation today on how these drugs are not recommended and are not healthy or regulated. Patient is aware what they have previously done to his body.  Will repeat some labs today if patient chooses to continue against medical advice would continue to monitor liver function, CK and kidney function.

## 2023-08-07 ENCOUNTER — Other Ambulatory Visit: Payer: Self-pay | Admitting: Physician Assistant

## 2023-08-07 ENCOUNTER — Other Ambulatory Visit (INDEPENDENT_AMBULATORY_CARE_PROVIDER_SITE_OTHER)

## 2023-08-07 DIAGNOSIS — M6282 Rhabdomyolysis: Secondary | ICD-10-CM

## 2023-08-07 LAB — COMPREHENSIVE METABOLIC PANEL
ALT: 90 U/L — ABNORMAL HIGH (ref 0–53)
AST: 126 U/L — ABNORMAL HIGH (ref 0–37)
Albumin: 4.3 g/dL (ref 3.5–5.2)
Alkaline Phosphatase: 72 U/L (ref 39–117)
BUN: 13 mg/dL (ref 6–23)
CO2: 29 meq/L (ref 19–32)
Calcium: 9.7 mg/dL (ref 8.4–10.5)
Chloride: 101 meq/L (ref 96–112)
Creatinine, Ser: 0.88 mg/dL (ref 0.40–1.50)
GFR: 109.24 mL/min (ref >60.00)
Glucose, Bld: 95 mg/dL (ref 70–99)
Potassium: 4.5 meq/L (ref 3.5–5.1)
Sodium: 137 meq/L (ref 135–145)
Total Bilirubin: 0.4 mg/dL (ref 0.2–1.2)
Total Protein: 7.2 g/dL (ref 6.0–8.3)

## 2023-08-07 LAB — CK: Total CK: 5252 U/L — ABNORMAL HIGH (ref 7–232)

## 2023-08-07 NOTE — Telephone Encounter (Signed)
Called pt back he was concerned taking mucinex max cold and flu last week could have contributed to his lab abnormalities.  Reports he was taking recommended dose on box  Advised tylenol in that product could have contributed to lft elevation, but shouldn't have caused such a steep CK elevation  Pt repeated labs this AM will let him know when they are back

## 2023-08-12 ENCOUNTER — Telehealth: Admitting: Adult Health

## 2023-08-15 ENCOUNTER — Other Ambulatory Visit: Payer: Self-pay | Admitting: Physician Assistant

## 2023-08-15 ENCOUNTER — Other Ambulatory Visit (INDEPENDENT_AMBULATORY_CARE_PROVIDER_SITE_OTHER)

## 2023-08-15 DIAGNOSIS — M6282 Rhabdomyolysis: Secondary | ICD-10-CM

## 2023-08-15 LAB — HEPATIC FUNCTION PANEL
ALT: 63 U/L — ABNORMAL HIGH (ref 0–53)
AST: 38 U/L — ABNORMAL HIGH (ref 0–37)
Albumin: 4.4 g/dL (ref 3.5–5.2)
Alkaline Phosphatase: 73 U/L (ref 39–117)
Bilirubin, Direct: 0.1 mg/dL (ref 0.0–0.3)
Total Bilirubin: 0.5 mg/dL (ref 0.2–1.2)
Total Protein: 7.4 g/dL (ref 6.0–8.3)

## 2023-08-15 LAB — CK: Total CK: 241 U/L — ABNORMAL HIGH (ref 7–232)

## 2023-09-09 ENCOUNTER — Encounter: Payer: Self-pay | Admitting: Adult Health

## 2023-09-09 ENCOUNTER — Telehealth (INDEPENDENT_AMBULATORY_CARE_PROVIDER_SITE_OTHER): Admitting: Adult Health

## 2023-09-09 DIAGNOSIS — G4733 Obstructive sleep apnea (adult) (pediatric): Secondary | ICD-10-CM | POA: Diagnosis not present

## 2023-09-09 NOTE — Patient Instructions (Signed)
Begin CPAP at bedtime, wear all night long Work on healthy weight loss Do not drive if sleepy Follow-up in 3 months and as needed

## 2023-09-09 NOTE — Progress Notes (Signed)
Virtual Visit via Video Note  I connected with Daniel Garrett on 09/09/23 at  3:30 PM EST by a video enabled telemedicine application and verified that I am speaking with the correct person using two identifiers.  Location: Patient: Home Provider: Office    I discussed the limitations of evaluation and management by telemedicine and the availability of in person appointments. The patient expressed understanding and agreed to proceed.  History of Present Illness: 37 yo male seen for sleep consult 06/17/23 for snoring, disrupted sleep witnessed apneic events and gasping/choking in his sleep.  Today's video visit is a 65-month follow-up to discuss sleep study results.  As above patient was seen for sleep consult last visit.  He had significant loud snoring, restless sleep, witnessed apneic event and gasping and choking during his sleep.  He was set up for home sleep study that was done on July 11, 2023 that showed mild sleep apnea with a AHI at 12.8/hour and SpO2 low at 89% we discussed treatment options including weight loss, oral appliance and CPAP therapy.  Patient does not feel that he would be able to use an oral appliance.  Wants to proceed with CPAP therapy.   Observations/Objective: Appears well in no acute distress  Assessment and Plan: Mild obstructive sleep apnea-patient education with sleep apnea given.  Patient will begin CPAP therapy auto CPAP 5 to 15 cm H2O.  - discussed how weight can impact sleep and risk for sleep disordered breathing - discussed options to assist with weight loss: combination of diet modification, cardiovascular and strength training exercises   - had an extensive discussion regarding the adverse health consequences related to untreated sleep disordered breathing - specifically discussed the risks for hypertension, coronary artery disease, cardiac dysrhythmias, cerebrovascular disease, and diabetes - lifestyle modification discussed   - discussed how  sleep disruption can increase risk of accidents, particularly when driving - safe driving practices were discussed   Plan  Patient Instructions  Begin CPAP at bedtime, wear all night long Work on healthy weight loss Do not drive if sleepy Follow-up in 3 months and as needed    Follow Up Instructions:    I discussed the assessment and treatment plan with the patient. The patient was provided an opportunity to ask questions and all were answered. The patient agreed with the plan and demonstrated an understanding of the instructions.   The patient was advised to call back or seek an in-person evaluation if the symptoms worsen or if the condition fails to improve as anticipated.  I provided 21 minutes of non-face-to-face time during this encounter.   Rubye Oaks, NP

## 2024-01-24 ENCOUNTER — Encounter: Payer: Self-pay | Admitting: Adult Health

## 2024-01-24 ENCOUNTER — Telehealth: Payer: Self-pay | Admitting: Adult Health

## 2024-01-24 DIAGNOSIS — G4733 Obstructive sleep apnea (adult) (pediatric): Secondary | ICD-10-CM

## 2024-01-24 NOTE — Progress Notes (Signed)
 Virtual Visit via Video Note  I connected with Daniel Garrett on 01/24/24 at  4:00 PM EDT by a video enabled telemedicine application and verified that I am speaking with the correct person using two identifiers.  Location: Patient: Home  Provider: Office    I discussed the limitations of evaluation and management by telemedicine and the availability of in person appointments. The patient expressed understanding and agreed to proceed.  History of Present Illness: 38 year old male seen for sleep consult September 2024 for snoring found to have mild obstructive sleep apnea.  Today's video visit is a 79-month follow-up for sleep apnea.  Patient was seen in September 2024 for snoring and witnessed apneic events.  He was set up for home sleep study that was done July 11, 2023 that showed mild sleep apnea with AHI at 12.8/hour and SpO2 low at 89%. Patient was started on CPAP therapy.  Since last visit patient says he is doing better with the last daytime sleepiness.  Feels that he is benefiting from CPAP. Using full face. Headgear sometimes it comes loose at times.  Wife loves the CPAP. His snoring has resolved. Wakes up feeling much better and rested . Headaches have resolved. Not as tired in the evening.      Observations/Objective: July 11, 2023 that showed mild sleep apnea with a AHI at 12.8/hour and SpO2 low at 89%    Appears well no acute distress   Assessment and Plan: Mild obstructive sleep apnea.  Patient had excellent control compliance on nocturnal CPAP.  Continue on current settings.  CPAP care discussed in detail.  Plan  Patient Instructions  Continue on CPAP at bedtime, wear all night long for greater than 6 or more hours Work on healthy weight loss Do not drive if sleepy Follow-up in 6 months and as needed    Follow Up Instructions:    I discussed the assessment and treatment plan with the patient. The patient was provided an opportunity to ask questions and  all were answered. The patient agreed with the plan and demonstrated an understanding of the instructions.   The patient was advised to call back or seek an in-person evaluation if the symptoms worsen or if the condition fails to improve as anticipated.  I provided 21 minutes of non-face-to-face time during this encounter.   Roena Clark, NP

## 2024-01-24 NOTE — Patient Instructions (Signed)
 Continue on CPAP at bedtime, wear all night long for greater than 6 or more hours Work on healthy weight loss Do not drive if sleepy Follow-up in 6 months and as needed

## 2024-06-11 ENCOUNTER — Other Ambulatory Visit: Payer: Self-pay

## 2024-08-05 ENCOUNTER — Encounter: Payer: Self-pay | Admitting: Family Medicine

## 2024-08-12 ENCOUNTER — Ambulatory Visit (INDEPENDENT_AMBULATORY_CARE_PROVIDER_SITE_OTHER): Payer: Self-pay | Admitting: Sports Medicine

## 2024-08-12 ENCOUNTER — Ambulatory Visit (HOSPITAL_BASED_OUTPATIENT_CLINIC_OR_DEPARTMENT_OTHER)
Admission: RE | Admit: 2024-08-12 | Discharge: 2024-08-12 | Disposition: A | Discharge status: Home / Self Care | Source: Ambulatory Visit | Attending: Sports Medicine | Admitting: Sports Medicine

## 2024-08-12 ENCOUNTER — Encounter: Payer: Self-pay | Admitting: Sports Medicine

## 2024-08-12 VITALS — BP 126/89 | HR 63 | Temp 98.3°F | Ht 68.0 in | Wt 196.1 lb

## 2024-08-12 DIAGNOSIS — K760 Fatty (change of) liver, not elsewhere classified: Secondary | ICD-10-CM | POA: Diagnosis not present

## 2024-08-12 DIAGNOSIS — Z131 Encounter for screening for diabetes mellitus: Secondary | ICD-10-CM

## 2024-08-12 DIAGNOSIS — M545 Low back pain, unspecified: Secondary | ICD-10-CM | POA: Diagnosis present

## 2024-08-12 DIAGNOSIS — Z6829 Body mass index (BMI) 29.0-29.9, adult: Secondary | ICD-10-CM | POA: Diagnosis not present

## 2024-08-12 DIAGNOSIS — R5383 Other fatigue: Secondary | ICD-10-CM

## 2024-08-12 DIAGNOSIS — G8929 Other chronic pain: Secondary | ICD-10-CM | POA: Diagnosis present

## 2024-08-12 DIAGNOSIS — G4733 Obstructive sleep apnea (adult) (pediatric): Secondary | ICD-10-CM

## 2024-08-12 DIAGNOSIS — R748 Abnormal levels of other serum enzymes: Secondary | ICD-10-CM

## 2024-08-12 DIAGNOSIS — M5441 Lumbago with sciatica, right side: Secondary | ICD-10-CM | POA: Diagnosis not present

## 2024-08-12 DIAGNOSIS — J302 Other seasonal allergic rhinitis: Secondary | ICD-10-CM | POA: Diagnosis not present

## 2024-08-12 LAB — COMPREHENSIVE METABOLIC PANEL WITH GFR
ALT: 28 U/L (ref 0–53)
AST: 21 U/L (ref 0–37)
Albumin: 4.7 g/dL (ref 3.5–5.2)
Alkaline Phosphatase: 59 U/L (ref 39–117)
BUN: 14 mg/dL (ref 6–23)
CO2: 30 meq/L (ref 19–32)
Calcium: 9.6 mg/dL (ref 8.4–10.5)
Chloride: 100 meq/L (ref 96–112)
Creatinine, Ser: 1.03 mg/dL (ref 0.40–1.50)
GFR: 91.63 mL/min (ref >60.00)
Glucose, Bld: 91 mg/dL (ref 70–99)
Potassium: 4.2 meq/L (ref 3.5–5.1)
Sodium: 138 meq/L (ref 135–145)
Total Bilirubin: 0.6 mg/dL (ref 0.2–1.2)
Total Protein: 7.5 g/dL (ref 6.0–8.3)

## 2024-08-12 LAB — CBC WITH DIFFERENTIAL/PLATELET
Basophils Absolute: 0.1 K/uL (ref 0.0–0.1)
Basophils Relative: 1.3 % (ref 0.0–3.0)
Eosinophils Absolute: 0.1 K/uL (ref 0.0–0.7)
Eosinophils Relative: 1.7 % (ref 0.0–5.0)
HCT: 44.6 % (ref 39.0–52.0)
Hemoglobin: 15.6 g/dL (ref 13.0–17.0)
Lymphocytes Relative: 33.9 % (ref 12.0–46.0)
Lymphs Abs: 1.6 K/uL (ref 0.7–4.0)
MCHC: 35 g/dL (ref 30.0–36.0)
MCV: 86.8 fl (ref 78.0–100.0)
Monocytes Absolute: 0.5 K/uL (ref 0.1–1.0)
Monocytes Relative: 10.6 % (ref 3.0–12.0)
Neutro Abs: 2.5 K/uL (ref 1.4–7.7)
Neutrophils Relative %: 52.5 % (ref 43.0–77.0)
Platelets: 256 K/uL (ref 150.0–400.0)
RBC: 5.14 Mil/uL (ref 4.22–5.81)
RDW: 13 % (ref 11.5–15.5)
WBC: 4.8 K/uL (ref 4.0–10.5)

## 2024-08-12 LAB — LIPID PANEL
Cholesterol: 201 mg/dL — ABNORMAL HIGH (ref 0–200)
HDL: 49.6 mg/dL (ref >39.00)
LDL Cholesterol: 136 mg/dL — ABNORMAL HIGH (ref 0–99)
NonHDL: 151.87
Total CHOL/HDL Ratio: 4
Triglycerides: 81 mg/dL (ref 0.0–149.0)
VLDL: 16.2 mg/dL (ref 0.0–40.0)

## 2024-08-12 LAB — TSH: TSH: 1.04 u[IU]/mL (ref 0.35–5.50)

## 2024-08-12 LAB — HEMOGLOBIN A1C: Hgb A1c MFr Bld: 5.1 % (ref 4.6–6.5)

## 2024-08-12 MED ORDER — BACLOFEN 5 MG PO TABS: 10.0000 mg | ORAL_TABLET | Freq: Two times a day (BID) | ORAL | 0 refills | Status: DC | PRN

## 2024-08-12 NOTE — Progress Notes (Signed)
 New Patient Office Visit  Subjective    Patient ID: Daniel Garrett, male    DOB: 01/23/1986  Age: 38 y.o. MRN: [id number]  CC: No chief complaint on file.   HPI  Daniel Garrett is a 38 year old male who presents to establish care c/o   worsening back pain and sciatica.  He experiences a dull pain in his back that worsens with movement, leading to irritation of the sciatic nerve. The pain radiates to his right glute and occasionally behind his shoulder. He describes the pain as a 'lightning' or 'electric shock' sensation, with a current intensity of about 5 out of 10. The pain began on July 19, 2024, after an incident on his garage roof where he strained his back while handling a tree limb. He has not engaged in physical therapy recently for this episode. He received a steroid shot and was prescribed Flexeril 10 mg and ibuprofen  800 mg, which he takes as needed. He avoids regular use of pain medication, preferring to manage pain through relaxation. No tingling or numbness in his legs, urinary or stool incontinence, and no weakness in his legs.  He has a history of elevated liver enzymes, which he attributes to the use of performance enhancing drugs . He has abstained from using  since his last blood work showed elevated levels.    He has a history of sleep apnea and uses a CPAP machine. He also reports seasonal allergies that developed post-COVID, managed with daily allergy medication. He notes some fatigue and anxiety related to his daughter's recent departure for basic training.  He vapes daily and has a history of minimal alcohol consumption, with no current use.   Outpatient Encounter Medications as of 08/12/2024  Medication Sig   albuterol (VENTOLIN HFA) 108 (90 Base) MCG/ACT inhaler Inhale 2 puffs into the lungs.   cyclobenzaprine (FLEXERIL) 10 MG tablet Take 5 mg by mouth.   ibuprofen  (ADVIL ) 800 MG tablet Take 800 mg by mouth every 8 (eight) hours as needed.   No  facility-administered encounter medications on file as of 08/12/2024.    Past Medical History:  Diagnosis Date   Varicose vein of scrotum     Past Surgical History:  Procedure Laterality Date   testicular varicose vein surgery     WISDOM TOOTH EXTRACTION     WISDOM TOOTH EXTRACTION      Family History  Problem Relation Age of Onset   ADD / ADHD Brother     Social History   Socioeconomic History   Marital status: Married    Spouse name: Not on file   Number of children: Not on file   Years of education: Not on file   Highest education level: Not on file  Occupational History   Not on file  Tobacco Use   Smoking status: Every Day    Current packs/day: 1.00    Types: Cigarettes   Smokeless tobacco: Never  Vaping Use   Vaping status: Never Used  Substance and Sexual Activity   Alcohol use: Yes   Drug use: No   Sexual activity: Not on file  Other Topics Concern   Not on file  Social History Narrative   Not on file   Social Drivers of Health   Financial Resource Strain: Not on file  Food Insecurity: Not on file  Transportation Needs: Not on file  Physical Activity: Not on file  Stress: Not on file  Social Connections: Not on file  Intimate Partner  Violence: Not on file    Review of Systems  Constitutional:  Negative for chills and fever.  HENT:  Negative for congestion and sore throat.   Respiratory:  Negative for cough, sputum production and shortness of breath.   Cardiovascular:  Negative for chest pain, palpitations and leg swelling.  Gastrointestinal:  Negative for abdominal pain, heartburn and nausea.  Genitourinary:  Negative for dysuria, frequency and hematuria.  Musculoskeletal:  Positive for back pain. Negative for falls and myalgias.  Neurological:  Negative for dizziness, sensory change and focal weakness.        Objective    There were no vitals taken for this visit.  Physical Exam Constitutional:      Appearance: Normal appearance.   HENT:     Head: Normocephalic and atraumatic.  Cardiovascular:     Rate and Rhythm: Normal rate and regular rhythm.     Pulses: Normal pulses.     Heart sounds: Normal heart sounds.  Pulmonary:     Effort: No respiratory distress.     Breath sounds: No stridor. No wheezing or rales.  Abdominal:     General: Bowel sounds are normal. There is no distension.     Palpations: Abdomen is soft.     Tenderness: There is no abdominal tenderness. There is no right CVA tenderness or guarding.  Musculoskeletal:        General: No swelling.     Comments: Spinal tenderness  SLR - Neg  Strength and sensations intact   Neurological:     Mental Status: He is alert. Mental status is at baseline.     Sensory: No sensory deficit.     Motor: No weakness.         Assessment & Plan:   Problem List Items Addressed This Visit   None 1. 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  Dispense: 60 tablet; Refill: 0  2. Seasonal allergies Cont with claritin prn  No sinus tenderness or cough   3. OSA on CPAP Cont with cpap  4. Other fatigue  - CBC with Differential/Platelet - Comp Met (CMET) - TSH  5. Elevated liver enzymes  - Comp Met (CMET)  6. Screening for diabetes mellitus  - HgB A1c  7. BMI 29.0-29.9,adult  - Lipid Profile  8. Fatty liver FIB score - low risk  Avoid hepatotoxic meds Exercise regularly  Diet rich in fruits and vegetables - Lipid Profile   No follow-ups on file.   Jackalyn Blazing, MD

## 2024-08-12 NOTE — Patient Instructions (Signed)
 If labs were collected or images ordered, we will inform you of results once we have received and reviewed them. We will contact you either by fpl group, or telephone call.  If a referral to a specialist was entered for you, please call us  in 2 weeks if you have not heard from the specialist office to schedule.   Thank you for choosing us  for your care. Wishing you a healthy and peaceful winter season. Stay well and we look forward to seeing you at your next visit.

## 2024-08-13 ENCOUNTER — Ambulatory Visit: Payer: Self-pay | Admitting: Sports Medicine

## 2024-08-14 NOTE — Telephone Encounter (Signed)
 LM for pt to return call to discuss.

## 2024-08-14 NOTE — Telephone Encounter (Signed)
 Copied from CRM [id number]. Topic: Clinical - Lab/Test Results >> Aug 13, 2024 12:12 PM Victoria A wrote: Reason for CRM: Agent read results verbatim to patient he said that he would like for the Nurse to call him regarding the results

## 2024-08-17 ENCOUNTER — Ambulatory Visit: Attending: Sports Medicine

## 2024-08-17 ENCOUNTER — Other Ambulatory Visit: Payer: Self-pay

## 2024-08-17 DIAGNOSIS — M5441 Lumbago with sciatica, right side: Secondary | ICD-10-CM | POA: Diagnosis present

## 2024-08-17 DIAGNOSIS — G8929 Other chronic pain: Secondary | ICD-10-CM | POA: Diagnosis present

## 2024-08-17 DIAGNOSIS — M545 Low back pain, unspecified: Secondary | ICD-10-CM | POA: Diagnosis not present

## 2024-08-17 NOTE — Therapy (Signed)
 OUTPATIENT PHYSICAL THERAPY THORACOLUMBAR EVALUATION   Patient Name: Daniel Garrett MRN: [id number] DOB:04/12/1986, 38 y.o., male Today's Date: 08/17/2024  END OF SESSION:  PT End of Session - 08/17/24 0828     Visit Number 1    Number of Visits 12    Date for Recertification  09/28/24    PT Start Time 0830    PT Stop Time 0930    PT Time Calculation (min) 60 min    Activity Tolerance Patient tolerated treatment well    Behavior During Therapy [facility] for tasks assessed/performed          Past Medical History:  Diagnosis Date   Varicose vein of scrotum    Past Surgical History:  Procedure Laterality Date   testicular varicose vein surgery     WISDOM TOOTH EXTRACTION     WISDOM TOOTH EXTRACTION     Patient Active Problem List   Diagnosis Date Noted   Snoring 06/18/2023   Performance enhancing drug abuse (HCC) 05/02/2023   Witnessed episode of apnea 05/02/2023   Elevated liver enzymes 05/02/2023   Current every day vaping 05/02/2023    PCP: Sherlynn Madden, MD  REFERRING PROVIDER: Sherlynn Madden, MD  REFERRING DIAG: M54.50,G89.29 (ICD-10-CM) - Chronic right-sided low back pain without sciatica  Rationale for Evaluation and Treatment: Rehabilitation  THERAPY DIAG:  Chronic right-sided low back pain with right-sided sciatica  ONSET DATE: 2007  SUBJECTIVE:                                                                                                                                                                                           SUBJECTIVE STATEMENT: Pt reports years of low back pain with flare ups that do not last extended periods of time. Pt reports this episode started on October 19th after attempting to lift a branch off of his garage roof. Immediate pain and shock felt in low back causing pt to lie down for a few minutes. He had trouble sleeping that night. Since onset, the pt has pain constantly with increase from prolonged  positioning, bending, and lifting (bracing). He notes alleviated symptoms when offloading the R side. Radiating pain through glute but not down LE.    PERTINENT HISTORY:  Chronic low back pain Performance enhancing drug abuser   PAIN:  Are you having pain? Yes: NPRS scale: 5/10; 8/10 at worst; 4/10 best  Pain location: R upper glute/lower back Pain description: sharp, shock, dull Aggravating factors: prolonged sitting, prolonged standing  Relieving factors: L side lying, sitting with L side leaning  PRECAUTIONS: None  RED FLAGS: None   WEIGHT BEARING RESTRICTIONS: No  FALLS:  Has patient fallen in last 6 months? No  LIVING ENVIRONMENT: Lives with: lives with their family Lives in: House/apartment Has following equipment at home: None  OCCUPATION: financial planner   PLOF: Independent  PATIENT GOALS: reduce pain, back to lifting  NEXT MD VISIT: 02/09/2025  OBJECTIVE:  Note: Objective measures were completed at Evaluation unless otherwise noted.  DIAGNOSTIC FINDINGS:  Lumbar X-ray: There is no evidence of lumbar spine fracture. Alignment is normal. Intervertebral disc spaces are maintained.  PATIENT SURVEYS:  Modified Oswestry:  MODIFIED OSWESTRY DISABILITY SCALE  Date: 08/17/2024 Score  Pain intensity 1 = The pain is bad, but I can manage without having to take pain medication  2. Personal care (washing, dressing, etc.) 1 =  I can take care of myself normally, but it increases my pain.  3. Lifting 3 = Pain prevents me from lifting heavy weights, but I can manage light to medium weights if they are conveniently positioned  4. Walking 3 =  Pain prevents me from walking more than  mile.  5. Sitting 1 =  I can only sit in my favorite chair as long as I like.  6. Standing 1 =  I can stand as long as I want but, it increases my pain.  7. Sleeping 1 = I can sleep well only by using pain medication.  8. Social Life 2 = Pain prevents me from participating in more energetic  activities (eg. sports, dancing).  9. Traveling 1 =  I can travel anywhere, but it increases my pain.  10. Employment/ Homemaking 1 = My normal homemaking/job activities increase my pain, but I can still perform all that is required of me  Total 15/50 = 30%   Interpretation of scores: Score Category Description  0-20% Minimal Disability The patient can cope with most living activities. Usually no treatment is indicated apart from advice on lifting, sitting and exercise  21-40% Moderate Disability The patient experiences more pain and difficulty with sitting, lifting and standing. Travel and social life are more difficult and they may be disabled from work. Personal care, sexual activity and sleeping are not grossly affected, and the patient can usually be managed by conservative means  41-60% Severe Disability Pain remains the main problem in this group, but activities of daily living are affected. These patients require a detailed investigation  61-80% Crippled Back pain impinges on all aspects of the patient's life. Positive intervention is required  81-100% Bed-bound These patients are either bed-bound or exaggerating their symptoms  Bluford FORBES Zoe DELENA Karon DELENA, et al. Surgery versus conservative management of stable thoracolumbar fracture: the PRESTO feasibility RCT. [place] ([place]): [employer]; 2021 Nov. [facility] Technology Assessment, No. 25.62.) Appendix 3, Oswestry Disability Index category descriptors. Available from: [email or web address].cz  Minimally Clinically Important Difference (MCID) = 12.8%  COGNITION: Overall cognitive status: Within functional limits for tasks assessed     SENSATION: WFL  POSTURE: rounded shoulders, decreased lumbar lordosis, posterior pelvic tilt, and weight shift left  PALPATION: Tenderness to R gluteal complex, R proximal hamstring, and R distal lumbar region   LUMBAR ROM:   AROM eval  Flexion Knee height; pain     Extension 90% limited; pain  Right lateral flexion WNL; no pain  Left lateral flexion WNL; pain   Right rotation WNL; no pain  Left rotation WNL; no pain   (Blank rows = not tested)  LOWER EXTREMITY ROM:     Passive  Right eval Left eval  Hip flexion  Hip extension    Hip abduction    Hip adduction    Hip internal rotation WNL; no pain   Hip external rotation WNL; pain   Knee flexion    Knee extension    Ankle dorsiflexion    Ankle plantarflexion    Ankle inversion    Ankle eversion     (Blank rows = not tested)  LOWER EXTREMITY MMT:    MMT Right eval Left eval  Hip flexion    Hip extension    Hip abduction    Hip adduction    Hip internal rotation    Hip external rotation    Knee flexion    Knee extension    Ankle dorsiflexion    Ankle plantarflexion    Ankle inversion    Ankle eversion     (Blank rows = not tested)  LUMBAR SPECIAL TESTS:  Straight leg raise test: Positive on R   FUNCTIONAL TESTS:  Deferred to future date   Functional squat    Hip hinge    Single leg stance   JOINT MOBILITY:    Hypomobile R lumbar facet due to pain   TREATMENT DATE: 08/17/2024                                                                                                                              Therapeutic exercise -  PPT supine x 10  TrA set x 10  Gluteal set x 10  Clam shell x 10   PATIENT EDUCATION:  Education details: HEP, core function and how to engage, not avoiding movement but modifying instead, diagnosis, prognosis, POC.  Person educated: Patient Education method: Explanation, Demonstration, and Verbal cues Education comprehension: verbalized understanding, returned demonstration, and needs further education  HOME EXERCISE PROGRAM: Access Code: [email or web address] URL: [email or web address] Date: 08/17/2024 Prepared by: Marijo Berber  Exercises - Supine Posterior Pelvic Tilt  - 1 x daily - 7 x weekly - 3 sets - 10 reps - 5s hold -  Clamshell  - 1 x daily - 7 x weekly - 3 sets - 10 reps - Supine Gluteal Sets  - 1 x daily - 7 x weekly - 3 sets - 10 reps - 5s hold  ASSESSMENT:  CLINICAL IMPRESSION: Patient is a 38 y.o. male who was seen today for physical therapy evaluation and treatment for R sided low back pain/gluteal pain. Pt presents with lumbar mobility deficits into flexion and extension, decreased tolerance to hip mobility, and pain radiating from low back to glute on R side. The s/s are correlated with R facet joint impairment causing radicular pain to the upper gluteal region. This pt will benefit from skilled physical therapy to return to PLOF with bed mobility, walking, and recreational activities.   OBJECTIVE IMPAIRMENTS: decreased mobility, difficulty walking, decreased ROM, decreased strength, impaired flexibility, and improper body mechanics.   ACTIVITY LIMITATIONS: lifting, bending, sitting, standing, squatting, and sleeping  PARTICIPATION LIMITATIONS: cleaning, driving, and yard  work  PERSONAL FACTORS: Time since onset of injury/illness/exacerbation and 1-2 comorbidities: performance enhancing drug abuse are also affecting patient's functional outcome.   REHAB POTENTIAL: Good  CLINICAL DECISION MAKING: Evolving/moderate complexity  EVALUATION COMPLEXITY: Moderate   GOALS: Goals reviewed with patient? Yes  SHORT TERM GOALS: Target date: 09/07/2024  Pt will be compliant and independent with the HEP to assist with symptom management at home.  Baseline: [id number] Goal status: INITIAL  2.  Pt will report resting pain at or below 3/10 NPRS to improve QOL at work and home.  Baseline: 5/10  Goal status: INITIAL  3.  Pt will be able to initiate LE exercises, from previous lifting program, with at least 25% of max in order to return to PLOF.  Baseline: not performing LE exercises Goal status: INITIAL   LONG TERM GOALS: Target date: 09/28/2024  Pt will report 3/10 or less pain at worst on NPRS in  improve QOL.  Baseline: 8/10 Goal status: INITIAL  2.  Pt will have 20% or less disability score on ODI to demonstrate improved function.  Baseline: 30%  Goal status: INITIAL  3.  Pt will have full and pain free lumbar mobility in order to tolerance bed mobility, lifting, and work tasks.  Baseline: see objective measures  Goal status: INITIAL  PLAN:  PT FREQUENCY: 1-2x/week  PT DURATION: 6 weeks  PLANNED INTERVENTIONS: 97110-Therapeutic exercises, 97530- Therapeutic activity, [id number]- Neuromuscular re-education, 97535- Self Care, 02859- Manual therapy, G0283- Electrical stimulation (unattended), 20560 (1-2 muscles), 20561 (3+ muscles)- Dry Needling, Patient/Family education, Joint manipulation, and Spinal mobilization.  PLAN FOR NEXT SESSION: assess response to HEP, teach foam roller use, functional movement for LE, dry needling, manual if warranted.    Marijo DELENA Berber, PT 08/17/2024, 9:38 AM

## 2024-08-20 ENCOUNTER — Ambulatory Visit

## 2024-08-25 ENCOUNTER — Ambulatory Visit

## 2024-08-25 DIAGNOSIS — M5441 Lumbago with sciatica, right side: Secondary | ICD-10-CM | POA: Diagnosis not present

## 2024-08-25 DIAGNOSIS — G8929 Other chronic pain: Secondary | ICD-10-CM

## 2024-08-25 NOTE — Therapy (Signed)
 OUTPATIENT PHYSICAL THERAPY THORACOLUMBAR TREATMENT   Patient Name: Daniel Garrett MRN: [id number] DOB:January 25, 1986, 38 y.o., male Today's Date: 08/25/2024  END OF SESSION:  PT End of Session - 08/25/24 0829     Visit Number 2    Number of Visits 12    Date for Recertification  09/28/24    PT Start Time 0830    PT Stop Time 0915    PT Time Calculation (min) 45 min          Past Medical History:  Diagnosis Date   Varicose vein of scrotum    Past Surgical History:  Procedure Laterality Date   testicular varicose vein surgery     WISDOM TOOTH EXTRACTION     WISDOM TOOTH EXTRACTION     Patient Active Problem List   Diagnosis Date Noted   Snoring 06/18/2023   Performance enhancing drug abuse (HCC) 05/02/2023   Witnessed episode of apnea 05/02/2023   Elevated liver enzymes 05/02/2023   Current every day vaping 05/02/2023    PCP: Sherlynn Madden, MD  REFERRING PROVIDER: Sherlynn Madden, MD  REFERRING DIAG: M54.50,G89.29 (ICD-10-CM) - Chronic right-sided low back pain without sciatica  Rationale for Evaluation and Treatment: Rehabilitation  THERAPY DIAG:  Chronic right-sided low back pain with right-sided sciatica  ONSET DATE: 2007  SUBJECTIVE:                                                                                                                                                                                           SUBJECTIVE STATEMENT: 08/25/2024  Pt reports no pain upon arrival. He has been doing his HEP daily. Pt expresses better ability to bend, twist, and sleep. He has yet to return to lower body weight training.   Pt reports years of low back pain with flare ups that do not last extended periods of time. Pt reports this episode started on October 19th after attempting to lift a branch off of his garage roof. Immediate pain and shock felt in low back causing pt to lie down for a few minutes. He had trouble sleeping that night. Since  onset, the pt has pain constantly with increase from prolonged positioning, bending, and lifting (bracing). He notes alleviated symptoms when offloading the R side. Radiating pain through glute but not down LE.    PERTINENT HISTORY:  Chronic low back pain Performance enhancing drug abuser   PAIN:  Are you having pain? Yes: NPRS scale: 5/10; 8/10 at worst; 4/10 best  Pain location: R upper glute/lower back Pain description: sharp, shock, dull Aggravating factors: prolonged sitting, prolonged standing  Relieving factors: L side lying, sitting with  L side leaning  PRECAUTIONS: None  RED FLAGS: None   WEIGHT BEARING RESTRICTIONS: No  FALLS:  Has patient fallen in last 6 months? No  LIVING ENVIRONMENT: Lives with: lives with their family Lives in: House/apartment Has following equipment at home: None  OCCUPATION: financial planner   PLOF: Independent  PATIENT GOALS: reduce pain, back to lifting  NEXT MD VISIT: 02/09/2025  OBJECTIVE:  Note: Objective measures were completed at Evaluation unless otherwise noted.  DIAGNOSTIC FINDINGS:  Lumbar X-ray: There is no evidence of lumbar spine fracture. Alignment is normal. Intervertebral disc spaces are maintained.  PATIENT SURVEYS:  Modified Oswestry:  MODIFIED OSWESTRY DISABILITY SCALE  Date: 08/17/2024 Score  Pain intensity 1 = The pain is bad, but I can manage without having to take pain medication  2. Personal care (washing, dressing, etc.) 1 =  I can take care of myself normally, but it increases my pain.  3. Lifting 3 = Pain prevents me from lifting heavy weights, but I can manage light to medium weights if they are conveniently positioned  4. Walking 3 =  Pain prevents me from walking more than  mile.  5. Sitting 1 =  I can only sit in my favorite chair as long as I like.  6. Standing 1 =  I can stand as long as I want but, it increases my pain.  7. Sleeping 1 = I can sleep well only by using pain medication.  8. Social  Life 2 = Pain prevents me from participating in more energetic activities (eg. sports, dancing).  9. Traveling 1 =  I can travel anywhere, but it increases my pain.  10. Employment/ Homemaking 1 = My normal homemaking/job activities increase my pain, but I can still perform all that is required of me  Total 15/50 = 30%   Interpretation of scores: Score Category Description  0-20% Minimal Disability The patient can cope with most living activities. Usually no treatment is indicated apart from advice on lifting, sitting and exercise  21-40% Moderate Disability The patient experiences more pain and difficulty with sitting, lifting and standing. Travel and social life are more difficult and they may be disabled from work. Personal care, sexual activity and sleeping are not grossly affected, and the patient can usually be managed by conservative means  41-60% Severe Disability Pain remains the main problem in this group, but activities of daily living are affected. These patients require a detailed investigation  61-80% Crippled Back pain impinges on all aspects of the patient's life. Positive intervention is required  81-100% Bed-bound These patients are either bed-bound or exaggerating their symptoms  Bluford FORBES Zoe DELENA Karon DELENA, et al. Surgery versus conservative management of stable thoracolumbar fracture: the PRESTO feasibility RCT. [place] ([place]): [employer]; 2021 Nov. [facility] Technology Assessment, No. 25.62.) Appendix 3, Oswestry Disability Index category descriptors. Available from: Findjewelers.cz  Minimally Clinically Important Difference (MCID) = 12.8%  COGNITION: Overall cognitive status: Within functional limits for tasks assessed     SENSATION: WFL  POSTURE: rounded shoulders, decreased lumbar lordosis, posterior pelvic tilt, and weight shift left  PALPATION: Tenderness to R gluteal complex, R proximal hamstring, and R distal lumbar  region   LUMBAR ROM:   AROM eval  Flexion Knee height; pain    Extension 90% limited; pain  Right lateral flexion WNL; no pain  Left lateral flexion WNL; pain   Right rotation WNL; no pain  Left rotation WNL; no pain   (Blank rows = not tested)  LOWER EXTREMITY ROM:     Passive  Right eval Left eval  Hip flexion    Hip extension    Hip abduction    Hip adduction    Hip internal rotation WNL; no pain   Hip external rotation WNL; pain   Knee flexion    Knee extension    Ankle dorsiflexion    Ankle plantarflexion    Ankle inversion    Ankle eversion     (Blank rows = not tested)  LOWER EXTREMITY MMT:    MMT Right eval Left eval  Hip flexion    Hip extension    Hip abduction    Hip adduction    Hip internal rotation    Hip external rotation    Knee flexion    Knee extension    Ankle dorsiflexion    Ankle plantarflexion    Ankle inversion    Ankle eversion     (Blank rows = not tested)  LUMBAR SPECIAL TESTS:  Straight leg raise test: Positive on R   FUNCTIONAL TESTS:  Deferred to future date   Functional squat    Hip hinge    Single leg stance   JOINT MOBILITY:    Hypomobile R lumbar facet due to pain   TREATMENT DATE:  [facility] Adult PT Treatment:                                                DATE: 08/25/2024                                                                                                                              Neuro Re-Ed -  Bridge x 12 Bridge with red band at knees 2 x 12  S/L hip ABD 2 x 15 Seated hip IR with foam roll between knees x 10 B, x 10 YTB B   Therapeutic Exercise Seated knee extension 35# 2x15  Seated leg curl 45# 2x15   [facility] Adult PT Treatment:                                                DATE: 08/17/2024  Therapeutic exercise -  PPT supine x 10  TrA set x 10  Gluteal set x 10  Clam  shell x 10   PATIENT EDUCATION:  Education details: HEP, core function and how to engage, not avoiding movement but modifying instead, diagnosis, prognosis, POC.  Person educated: Patient Education method: Explanation, Demonstration, and Verbal cues Education comprehension: verbalized understanding, returned demonstration, and needs further education  HOME EXERCISE PROGRAM: Access Code: [email or web address] URL: [email or web address] Date: 08/25/2024 Prepared by: Marijo Berber  Exercises - Supine Bridge with Resistance Band  - 1 x daily - 7 x weekly - 3 sets - 10 reps - Seated Hip Internal Rotation with Ball and Resistance  - 1 x daily - 7 x weekly - 3 sets - 10 reps - Sidelying Hip Abduction  - 1 x daily - 7 x weekly - 3 sets - 10 reps  ASSESSMENT:  CLINICAL IMPRESSION: 08/25/2024 Pt arrived and exited without pain. He was guided through hip stabilization exercises in low level positions as well as return to typical lower body lift. Pt had no pain with leg extension or seated leg curl at 50% normal resistance. He was given an updated HEP on this date. The pt will benefit from skilled physical therapy to return decrease pain and increase function.    EVAL Patient is a 38 y.o. male who was seen today for physical therapy evaluation and treatment for R sided low back pain/gluteal pain. Pt presents with lumbar mobility deficits into flexion and extension, decreased tolerance to hip mobility, and pain radiating from low back to glute on R side. The s/s are correlated with R facet joint impairment causing radicular pain to the upper gluteal region. This pt will benefit from skilled physical therapy to return to PLOF with bed mobility, walking, and recreational activities.   OBJECTIVE IMPAIRMENTS: decreased mobility, difficulty walking, decreased ROM, decreased strength, impaired flexibility, and improper body mechanics.   ACTIVITY LIMITATIONS: lifting, bending, sitting, standing,  squatting, and sleeping  PARTICIPATION LIMITATIONS: cleaning, driving, and yard work  PERSONAL FACTORS: Time since onset of injury/illness/exacerbation and 1-2 comorbidities: performance enhancing drug abuse are also affecting patient's functional outcome.   REHAB POTENTIAL: Good  CLINICAL DECISION MAKING: Evolving/moderate complexity  EVALUATION COMPLEXITY: Moderate   GOALS: Goals reviewed with patient? Yes  SHORT TERM GOALS: Target date: 09/07/2024  Pt will be compliant and independent with the HEP to assist with symptom management at home.  Baseline: [id number] Goal status: INITIAL  2.  Pt will report resting pain at or below 3/10 NPRS to improve QOL at work and home.  Baseline: 5/10  Goal status: INITIAL  3.  Pt will be able to initiate LE exercises, from previous lifting program, with at least 25% of max in order to return to PLOF.  Baseline: not performing LE exercises Goal status: INITIAL   LONG TERM GOALS: Target date: 09/28/2024  Pt will report 3/10 or less pain at worst on NPRS in improve QOL.  Baseline: 8/10 Goal status: INITIAL  2.  Pt will have 20% or less disability score on ODI to demonstrate improved function.  Baseline: 30%  Goal status: INITIAL  3.  Pt will have full and pain free lumbar mobility in order to tolerance bed mobility, lifting, and work tasks.  Baseline: see objective measures  Goal status: INITIAL  PLAN:  PT FREQUENCY: 1-2x/week  PT DURATION: 6 weeks  PLANNED INTERVENTIONS: 97110-Therapeutic exercises, 97530- Therapeutic activity, [id number]- Neuromuscular re-education, 97535- Self Care, 02859- Manual therapy, G0283- Electrical stimulation (unattended),  79439 (1-2 muscles), 20561 (3+ muscles)- Dry Needling, Patient/Family education, Joint manipulation, and Spinal mobilization.  PLAN FOR NEXT SESSION: assess response to HEP, teach foam roller use, functional movement for LE, dry needling, manual if warranted, hip hinging.    Marijo DELENA Berber, PT 08/25/2024, 9:52 AM

## 2024-09-01 ENCOUNTER — Ambulatory Visit

## 2024-09-01 DIAGNOSIS — M5441 Lumbago with sciatica, right side: Secondary | ICD-10-CM | POA: Diagnosis present

## 2024-09-01 DIAGNOSIS — G8929 Other chronic pain: Secondary | ICD-10-CM | POA: Diagnosis present

## 2024-09-01 NOTE — Therapy (Signed)
 OUTPATIENT PHYSICAL THERAPY THORACOLUMBAR TREATMENT   Patient Name: Daniel Garrett MRN: [id number] DOB:12-26-1985, 38 y.o., male Today's Date: 09/01/2024  END OF SESSION:  PT End of Session - 09/01/24 0824     Visit Number 3    Number of Visits 12    Date for Recertification  09/28/24    PT Start Time 0830    PT Stop Time 0915    PT Time Calculation (min) 45 min    Activity Tolerance Patient tolerated treatment well    Behavior During Therapy [facility] for tasks assessed/performed          Past Medical History:  Diagnosis Date   Varicose vein of scrotum    Past Surgical History:  Procedure Laterality Date   testicular varicose vein surgery     WISDOM TOOTH EXTRACTION     WISDOM TOOTH EXTRACTION     Patient Active Problem List   Diagnosis Date Noted   Snoring 06/18/2023   Performance enhancing drug abuse (HCC) 05/02/2023   Witnessed episode of apnea 05/02/2023   Elevated liver enzymes 05/02/2023   Current every day vaping 05/02/2023    PCP: Sherlynn Madden, MD  REFERRING PROVIDER: Sherlynn Madden, MD  REFERRING DIAG: M54.50,G89.29 (ICD-10-CM) - Chronic right-sided low back pain without sciatica  Rationale for Evaluation and Treatment: Rehabilitation  THERAPY DIAG:  Chronic right-sided low back pain with right-sided sciatica  ONSET DATE: 2007  SUBJECTIVE:                                                                                                                                                                                           SUBJECTIVE STATEMENT: 09/01/2024  Pt reports no pain since last visit. He performed a standard leg day at 50% the other day without issues. Pt continues with HEP.   Pt reports years of low back pain with flare ups that do not last extended periods of time. Pt reports this episode started on October 19th after attempting to lift a branch off of his garage roof. Immediate pain and shock felt in low back causing pt to  lie down for a few minutes. He had trouble sleeping that night. Since onset, the pt has pain constantly with increase from prolonged positioning, bending, and lifting (bracing). He notes alleviated symptoms when offloading the R side. Radiating pain through glute but not down LE.    PERTINENT HISTORY:  Chronic low back pain Performance enhancing drug abuser   PAIN:  Are you having pain? Yes: NPRS scale: 5/10; 8/10 at worst; 4/10 best  Pain location: R upper glute/lower back Pain description: sharp, shock, dull Aggravating factors: prolonged  sitting, prolonged standing  Relieving factors: L side lying, sitting with L side leaning  PRECAUTIONS: None  RED FLAGS: None   WEIGHT BEARING RESTRICTIONS: No  FALLS:  Has patient fallen in last 6 months? No  LIVING ENVIRONMENT: Lives with: lives with their family Lives in: House/apartment Has following equipment at home: None  OCCUPATION: financial planner   PLOF: Independent  PATIENT GOALS: reduce pain, back to lifting  NEXT MD VISIT: 02/09/2025  OBJECTIVE:  Note: Objective measures were completed at Evaluation unless otherwise noted.  DIAGNOSTIC FINDINGS:  Lumbar X-ray: There is no evidence of lumbar spine fracture. Alignment is normal. Intervertebral disc spaces are maintained.  PATIENT SURVEYS:  Modified Oswestry:  MODIFIED OSWESTRY DISABILITY SCALE  Date: 08/17/2024 Score  Pain intensity 1 = The pain is bad, but I can manage without having to take pain medication  2. Personal care (washing, dressing, etc.) 1 =  I can take care of myself normally, but it increases my pain.  3. Lifting 3 = Pain prevents me from lifting heavy weights, but I can manage light to medium weights if they are conveniently positioned  4. Walking 3 =  Pain prevents me from walking more than  mile.  5. Sitting 1 =  I can only sit in my favorite chair as long as I like.  6. Standing 1 =  I can stand as long as I want but, it increases my pain.  7.  Sleeping 1 = I can sleep well only by using pain medication.  8. Social Life 2 = Pain prevents me from participating in more energetic activities (eg. sports, dancing).  9. Traveling 1 =  I can travel anywhere, but it increases my pain.  10. Employment/ Homemaking 1 = My normal homemaking/job activities increase my pain, but I can still perform all that is required of me  Total 15/50 = 30%   Interpretation of scores: Score Category Description  0-20% Minimal Disability The patient can cope with most living activities. Usually no treatment is indicated apart from advice on lifting, sitting and exercise  21-40% Moderate Disability The patient experiences more pain and difficulty with sitting, lifting and standing. Travel and social life are more difficult and they may be disabled from work. Personal care, sexual activity and sleeping are not grossly affected, and the patient can usually be managed by conservative means  41-60% Severe Disability Pain remains the main problem in this group, but activities of daily living are affected. These patients require a detailed investigation  61-80% Crippled Back pain impinges on all aspects of the patient's life. Positive intervention is required  81-100% Bed-bound These patients are either bed-bound or exaggerating their symptoms  Bluford FORBES Zoe DELENA Karon DELENA, et al. Surgery versus conservative management of stable thoracolumbar fracture: the PRESTO feasibility RCT. [place] ([place]): [employer]; 2021 Nov. [facility] Technology Assessment, No. 25.62.) Appendix 3, Oswestry Disability Index category descriptors. Available from: Findjewelers.cz  Minimally Clinically Important Difference (MCID) = 12.8%  COGNITION: Overall cognitive status: Within functional limits for tasks assessed     SENSATION: WFL  POSTURE: rounded shoulders, decreased lumbar lordosis, posterior pelvic tilt, and weight shift  left  PALPATION: Tenderness to R gluteal complex, R proximal hamstring, and R distal lumbar region   LUMBAR ROM:   AROM eval  Flexion Knee height; pain    Extension 90% limited; pain  Right lateral flexion WNL; no pain  Left lateral flexion WNL; pain   Right rotation WNL; no pain  Left  rotation WNL; no pain   (Blank rows = not tested)  LOWER EXTREMITY ROM:     Passive  Right eval Left eval  Hip flexion    Hip extension    Hip abduction    Hip adduction    Hip internal rotation WNL; no pain   Hip external rotation WNL; pain   Knee flexion    Knee extension    Ankle dorsiflexion    Ankle plantarflexion    Ankle inversion    Ankle eversion     (Blank rows = not tested)  LOWER EXTREMITY MMT:    MMT Right eval Left eval  Hip flexion    Hip extension    Hip abduction    Hip adduction    Hip internal rotation    Hip external rotation    Knee flexion    Knee extension    Ankle dorsiflexion    Ankle plantarflexion    Ankle inversion    Ankle eversion     (Blank rows = not tested)  LUMBAR SPECIAL TESTS:  Straight leg raise test: Positive on R   FUNCTIONAL TESTS:  Deferred to future date   Functional squat    Hip hinge    Single leg stance   JOINT MOBILITY:    Hypomobile R lumbar facet due to pain   TREATMENT DATE:  [facility] Adult PT Treatment:                                                DATE: 09/01/2024                                                                                                                              Neuro Re-Ed -  Side plank clam x 8 B Side plank clam YTB x 8 B Hip star x 8 B  Kettlebell RDL 25# 2x10 Kettlebell RDL with red band at waist attached to squat rack 2x10 Foam roller hamstring curls x6 then x9   [facility] Adult PT Treatment:                                                DATE: 08/17/2024  Therapeutic exercise -  PPT supine x 10  TrA set x 10  Gluteal set x 10  Clam shell x 10   PATIENT EDUCATION:  Education details: HEP, core function and how to engage, not avoiding movement but modifying instead, diagnosis, prognosis, POC.  Person educated: Patient Education method: Explanation, Demonstration, and Verbal cues Education comprehension: verbalized understanding, returned demonstration, and needs further education  HOME EXERCISE PROGRAM: Access Code: [email or web address] URL: [email or web address] Date: 08/25/2024 Prepared by: Marijo Berber  Exercises - Supine Bridge with Resistance Band  - 1 x daily - 7 x weekly - 3 sets - 10 reps - Seated Hip Internal Rotation with Ball and Resistance  - 1 x daily - 7 x weekly - 3 sets - 10 reps - Sidelying Hip Abduction  - 1 x daily - 7 x weekly - 3 sets - 10 reps  ASSESSMENT:  CLINICAL IMPRESSION: 09/01/2024 Pt guided through hip stability exercises prior to hip hinging. Pt performed stiff leg deadlift when asked to show what he does at the gym. The bar translated too far from his body causing increased force in the low back and poor pelvic control. Pt also demonstrated anterior pelvic tilt when performing concentric movement. He was educated on and guided through proper deadlift form to avoid the compensations he previously made. Form improved and connection to glutes was achieved.  The pt will benefit from skilled physical therapy to return decrease pain and increase function.    EVAL Patient is a 38 y.o. male who was seen today for physical therapy evaluation and treatment for R sided low back pain/gluteal pain. Pt presents with lumbar mobility deficits into flexion and extension, decreased tolerance to hip mobility, and pain radiating from low back to glute on R side. The s/s are correlated with R facet joint impairment causing radicular pain to the upper gluteal region. This pt will benefit from skilled physical therapy to  return to PLOF with bed mobility, walking, and recreational activities.   OBJECTIVE IMPAIRMENTS: decreased mobility, difficulty walking, decreased ROM, decreased strength, impaired flexibility, and improper body mechanics.   ACTIVITY LIMITATIONS: lifting, bending, sitting, standing, squatting, and sleeping  PARTICIPATION LIMITATIONS: cleaning, driving, and yard work  PERSONAL FACTORS: Time since onset of injury/illness/exacerbation and 1-2 comorbidities: performance enhancing drug abuse are also affecting patient's functional outcome.   REHAB POTENTIAL: Good  CLINICAL DECISION MAKING: Evolving/moderate complexity  EVALUATION COMPLEXITY: Moderate   GOALS: Goals reviewed with patient? Yes  SHORT TERM GOALS: Target date: 09/07/2024  Pt will be compliant and independent with the HEP to assist with symptom management at home.  Baseline: [id number] Goal status: INITIAL  2.  Pt will report resting pain at or below 3/10 NPRS to improve QOL at work and home.  Baseline: 5/10  Goal status: INITIAL  3.  Pt will be able to initiate LE exercises, from previous lifting program, with at least 25% of max in order to return to PLOF.  Baseline: not performing LE exercises Goal status: INITIAL   LONG TERM GOALS: Target date: 09/28/2024  Pt will report 3/10 or less pain at worst on NPRS in improve QOL.  Baseline: 8/10 Goal status: INITIAL  2.  Pt will have 20% or less disability score on ODI to demonstrate improved function.  Baseline: 30%  Goal status: INITIAL  3.  Pt will have full and pain free lumbar mobility in order to tolerance bed mobility, lifting, and work tasks.  Baseline: see objective measures  Goal status: INITIAL  PLAN:  PT FREQUENCY: 1-2x/week  PT DURATION: 6 weeks  PLANNED INTERVENTIONS: 97110-Therapeutic exercises, 97530- Therapeutic activity, [id number]- Neuromuscular re-education, 97535- Self Care, [id number]- Manual therapy, [id number]- Electrical stimulation (unattended), 20560  (1-2 muscles), 20561 (3+ muscles)- Dry Needling, Patient/Family education, Joint manipulation, and Spinal mobilization.  PLAN FOR NEXT SESSION: assess response to HEP, teach foam roller use, functional movement for LE, dry needling, manual if warranted, hip hinging.    Marijo DELENA Berber, PT 09/01/2024, 11:05 AM

## 2024-09-03 ENCOUNTER — Ambulatory Visit

## 2024-09-03 DIAGNOSIS — G8929 Other chronic pain: Secondary | ICD-10-CM

## 2024-09-03 DIAGNOSIS — M5441 Lumbago with sciatica, right side: Secondary | ICD-10-CM | POA: Diagnosis not present

## 2024-09-03 NOTE — Therapy (Signed)
 OUTPATIENT PHYSICAL THERAPY THORACOLUMBAR TREATMENT   Patient Name: Daniel Garrett MRN: [id number] DOB:08/14/1986, 38 y.o., male Today's Date: 09/03/2024  END OF SESSION:  PT End of Session - 09/03/24 0820     Visit Number 4    Number of Visits 12    Date for Recertification  09/28/24    PT Start Time 0830    PT Stop Time 0910    PT Time Calculation (min) 40 min    Activity Tolerance Patient tolerated treatment well    Behavior During Therapy [facility] for tasks assessed/performed           Past Medical History:  Diagnosis Date   Varicose vein of scrotum    Past Surgical History:  Procedure Laterality Date   testicular varicose vein surgery     WISDOM TOOTH EXTRACTION     WISDOM TOOTH EXTRACTION     Patient Active Problem List   Diagnosis Date Noted   Snoring 06/18/2023   Performance enhancing drug abuse (HCC) 05/02/2023   Witnessed episode of apnea 05/02/2023   Elevated liver enzymes 05/02/2023   Current every day vaping 05/02/2023    PCP: Sherlynn Madden, MD  REFERRING PROVIDER: Sherlynn Madden, MD  REFERRING DIAG: M54.50,G89.29 (ICD-10-CM) - Chronic right-sided low back pain without sciatica  Rationale for Evaluation and Treatment: Rehabilitation  THERAPY DIAG:  Chronic right-sided low back pain with right-sided sciatica  ONSET DATE: 2007  SUBJECTIVE:                                                                                                                                                                                           SUBJECTIVE STATEMENT: Patient states that he isn't having any pain just regular muscle soreness. States that he was sore after last session but no increased pain.  09/03/2024  Pt reports no pain since last visit. He performed a standard leg day at 50% the other day without issues. Pt continues with HEP.   Pt reports years of low back pain with flare ups that do not last extended periods of time. Pt reports this  episode started on October 19th after attempting to lift a branch off of his garage roof. Immediate pain and shock felt in low back causing pt to lie down for a few minutes. He had trouble sleeping that night. Since onset, the pt has pain constantly with increase from prolonged positioning, bending, and lifting (bracing). He notes alleviated symptoms when offloading the R side. Radiating pain through glute but not down LE.    PERTINENT HISTORY:  Chronic low back pain Performance enhancing drug abuser   PAIN:  Are you  having pain? Yes: NPRS scale: 5/10; 8/10 at worst; 4/10 best  Pain location: R upper glute/lower back Pain description: sharp, shock, dull Aggravating factors: prolonged sitting, prolonged standing  Relieving factors: L side lying, sitting with L side leaning  PRECAUTIONS: None  RED FLAGS: None   WEIGHT BEARING RESTRICTIONS: No  FALLS:  Has patient fallen in last 6 months? No  LIVING ENVIRONMENT: Lives with: lives with their family Lives in: House/apartment Has following equipment at home: None  OCCUPATION: financial planner   PLOF: Independent  PATIENT GOALS: reduce pain, back to lifting  NEXT MD VISIT: 02/09/2025  OBJECTIVE:  Note: Objective measures were completed at Evaluation unless otherwise noted.  DIAGNOSTIC FINDINGS:  Lumbar X-ray: There is no evidence of lumbar spine fracture. Alignment is normal. Intervertebral disc spaces are maintained.  PATIENT SURVEYS:  Modified Oswestry:  MODIFIED OSWESTRY DISABILITY SCALE  Date: 08/17/2024 Score  Pain intensity 1 = The pain is bad, but I can manage without having to take pain medication  2. Personal care (washing, dressing, etc.) 1 =  I can take care of myself normally, but it increases my pain.  3. Lifting 3 = Pain prevents me from lifting heavy weights, but I can manage light to medium weights if they are conveniently positioned  4. Walking 3 =  Pain prevents me from walking more than  mile.  5.  Sitting 1 =  I can only sit in my favorite chair as long as I like.  6. Standing 1 =  I can stand as long as I want but, it increases my pain.  7. Sleeping 1 = I can sleep well only by using pain medication.  8. Social Life 2 = Pain prevents me from participating in more energetic activities (eg. sports, dancing).  9. Traveling 1 =  I can travel anywhere, but it increases my pain.  10. Employment/ Homemaking 1 = My normal homemaking/job activities increase my pain, but I can still perform all that is required of me  Total 15/50 = 30%   Interpretation of scores: Score Category Description  0-20% Minimal Disability The patient can cope with most living activities. Usually no treatment is indicated apart from advice on lifting, sitting and exercise  21-40% Moderate Disability The patient experiences more pain and difficulty with sitting, lifting and standing. Travel and social life are more difficult and they may be disabled from work. Personal care, sexual activity and sleeping are not grossly affected, and the patient can usually be managed by conservative means  41-60% Severe Disability Pain remains the main problem in this group, but activities of daily living are affected. These patients require a detailed investigation  61-80% Crippled Back pain impinges on all aspects of the patient's life. Positive intervention is required  81-100% Bed-bound These patients are either bed-bound or exaggerating their symptoms  Bluford FORBES Zoe DELENA Karon DELENA, et al. Surgery versus conservative management of stable thoracolumbar fracture: the PRESTO feasibility RCT. [place] ([place]): [employer]; 2021 Nov. [facility] Technology Assessment, No. 25.62.) Appendix 3, Oswestry Disability Index category descriptors. Available from: Findjewelers.cz  Minimally Clinically Important Difference (MCID) = 12.8%  COGNITION: Overall cognitive status: Within functional limits for tasks  assessed     SENSATION: WFL  POSTURE: rounded shoulders, decreased lumbar lordosis, posterior pelvic tilt, and weight shift left  PALPATION: Tenderness to R gluteal complex, R proximal hamstring, and R distal lumbar region   LUMBAR ROM:   AROM eval  Flexion Knee height; pain  Extension 90% limited; pain  Right lateral flexion WNL; no pain  Left lateral flexion WNL; pain   Right rotation WNL; no pain  Left rotation WNL; no pain   (Blank rows = not tested)  LOWER EXTREMITY ROM:     Passive  Right eval Left eval  Hip flexion    Hip extension    Hip abduction    Hip adduction    Hip internal rotation WNL; no pain   Hip external rotation WNL; pain   Knee flexion    Knee extension    Ankle dorsiflexion    Ankle plantarflexion    Ankle inversion    Ankle eversion     (Blank rows = not tested)  LOWER EXTREMITY MMT:    MMT Right eval Left eval  Hip flexion    Hip extension    Hip abduction    Hip adduction    Hip internal rotation    Hip external rotation    Knee flexion    Knee extension    Ankle dorsiflexion    Ankle plantarflexion    Ankle inversion    Ankle eversion     (Blank rows = not tested)  LUMBAR SPECIAL TESTS:  Straight leg raise test: Positive on R   FUNCTIONAL TESTS:  Deferred to future date   Functional squat    Hip hinge    Single leg stance   JOINT MOBILITY:    Hypomobile R lumbar facet due to pain   TREATMENT DATE: [facility] Adult PT Treatment:                                                DATE: 09/03/24 Therapeutic Exercise: Treadmill x 5 mins  Walking lunges 2 laps SL Leg press 2 up 1 down 65# BIL 2x10 Cybex Hip abduction/extension BIL 2x10 37.5# Neuromuscular re-ed: RDL with bar 2x10 Landing Lunges to bosu ball BIL 2x10    [facility] Adult PT Treatment:                                                DATE: 09/01/2024                                                                                                                               Neuro Re-Ed -  Side plank clam x 8 B Side plank clam YTB x 8 B Hip star x 8 B  Kettlebell RDL 25# 2x10 Kettlebell RDL with red band at waist attached to squat rack 2x10 Foam roller hamstring curls x6 then x9   [facility] Adult PT Treatment:  DATE: 08/17/2024                                                                                                                              Therapeutic exercise -  PPT supine x 10  TrA set x 10  Gluteal set x 10  Clam shell x 10   PATIENT EDUCATION:  Education details: HEP, core function and how to engage, not avoiding movement but modifying instead, diagnosis, prognosis, POC.  Person educated: Patient Education method: Explanation, Demonstration, and Verbal cues Education comprehension: verbalized understanding, returned demonstration, and needs further education  HOME EXERCISE PROGRAM: Access Code: [email or web address] URL: [email or web address] Date: 08/25/2024 Prepared by: Marijo Berber  Exercises - Supine Bridge with Resistance Band  - 1 x daily - 7 x weekly - 3 sets - 10 reps - Seated Hip Internal Rotation with Ball and Resistance  - 1 x daily - 7 x weekly - 3 sets - 10 reps - Sidelying Hip Abduction  - 1 x daily - 7 x weekly - 3 sets - 10 reps  ASSESSMENT:  CLINICAL IMPRESSION: Patient presents to PT reporting that he hasn't have any pain since coming to PT, just normal muscle fatigue. He states that he's been incorporating exercises from PT into his gym routine. Demonstrated improved form of RDL with bar after cues for more of a hip hinge. Focused on SL isolation, and eccentric contraction. Patient will benefit from skilled PT to decrease pain and improve functional ability.  EVAL Patient is a 38 y.o. male who was seen today for physical therapy evaluation and treatment for R sided low back pain/gluteal pain. Pt presents with lumbar mobility deficits into flexion and extension,  decreased tolerance to hip mobility, and pain radiating from low back to glute on R side. The s/s are correlated with R facet joint impairment causing radicular pain to the upper gluteal region. This pt will benefit from skilled physical therapy to return to PLOF with bed mobility, walking, and recreational activities.   OBJECTIVE IMPAIRMENTS: decreased mobility, difficulty walking, decreased ROM, decreased strength, impaired flexibility, and improper body mechanics.   ACTIVITY LIMITATIONS: lifting, bending, sitting, standing, squatting, and sleeping  PARTICIPATION LIMITATIONS: cleaning, driving, and yard work  PERSONAL FACTORS: Time since onset of injury/illness/exacerbation and 1-2 comorbidities: performance enhancing drug abuse are also affecting patient's functional outcome.   REHAB POTENTIAL: Good  CLINICAL DECISION MAKING: Evolving/moderate complexity  EVALUATION COMPLEXITY: Moderate   GOALS: Goals reviewed with patient? Yes  SHORT TERM GOALS: Target date: 09/07/2024  Pt will be compliant and independent with the HEP to assist with symptom management at home.  Baseline: [id number] Goal status: INITIAL  2.  Pt will report resting pain at or below 3/10 NPRS to improve QOL at work and home.  Baseline: 5/10  Goal status: INITIAL  3.  Pt will be able to initiate LE exercises, from previous lifting program, with at least 25% of max in order to return to PLOF.  Baseline: not performing LE exercises Goal status: INITIAL   LONG TERM GOALS: Target date: 09/28/2024  Pt will report 3/10 or less pain at worst on NPRS in improve QOL.  Baseline: 8/10 Goal status: INITIAL  2.  Pt will have 20% or less disability score on ODI to demonstrate improved function.  Baseline: 30%  Goal status: INITIAL  3.  Pt will have full and pain free lumbar mobility in order to tolerance bed mobility, lifting, and work tasks.  Baseline: see objective measures  Goal status: INITIAL  PLAN:  PT  FREQUENCY: 1-2x/week  PT DURATION: 6 weeks  PLANNED INTERVENTIONS: 97110-Therapeutic exercises, 97530- Therapeutic activity, [id number]- Neuromuscular re-education, 97535- Self Care, 02859- Manual therapy, [id number]- Electrical stimulation (unattended), 20560 (1-2 muscles), 20561 (3+ muscles)- Dry Needling, Patient/Family education, Joint manipulation, and Spinal mobilization.  PLAN FOR NEXT SESSION: assess response to HEP, teach foam roller use, functional movement for LE, dry needling, manual if warranted, hip hinging.    Shanda Code, SPTA 09/03/2024, 8:20 AM

## 2024-09-04 NOTE — Progress Notes (Unsigned)
 Referring Physician:  Sherlynn Madden, MD [address] [place],  [place] [address]  Primary Physician:  Sherlynn Madden, MD  History of Present Illness: 09/10/2024 [name] has a history of elevated LFTs, performance enhancing drug abuse.   History of chronic intermittent LBP x years. This flare up started on 07/19/24 after lifting tree limb. He had constant LBP with right posterior leg pain to his foot initially.   He is much better. He has constant dull/aching pain in right lower back/buttock pain. Back feels tight. No current leg pain. No numbness, tingling, or weakness in legs. Pain is worse with quick turning of his back. Some relief with rest.   He is taking prn tylenol  or motrin . Takes baclofen  rarely.   Tobacco use: vapes with nicotine- 1 vape lasts a week  Bowel/Bladder Dysfunction: none  Conservative measures:  Physical therapy: PT at [facility] initial evaluation on 08/17/24 with 5 more visits through 09/10/2024. Plan to d/c at next visit Multimodal medical therapy including regular antiinflammatories:  Flexeril, Ibuprofen , Tylenol , Baclofen , Lidocaine  patches Injections: no epidural steroid injections  Past Surgery: no spine surgery  Daniel Garrett has no symptoms of cervical myelopathy.  The symptoms are causing a significant impact on the patient's life.   Review of Systems:  A 10 point review of systems is negative, except for the pertinent positives and negatives detailed in the HPI.  Past Medical History: Past Medical History:  Diagnosis Date   Varicose vein of scrotum     Past Surgical History: Past Surgical History:  Procedure Laterality Date   testicular varicose vein surgery     WISDOM TOOTH EXTRACTION     WISDOM TOOTH EXTRACTION      Allergies: Allergies as of 09/10/2024 - Review Complete 09/03/2024  Allergen Reaction Noted   Amoxicillin Hives 10/18/2014   Penicillins Hives 10/18/2014     Medications: Outpatient Encounter Medications as of 09/10/2024  Medication Sig   Baclofen  5 MG TABS Take 5 mg by mouth in the morning and at bedtime.   [DISCONTINUED] Baclofen  5 MG TABS Take 2 tablets (10 mg total) by mouth 2 (two) times daily as needed.   No facility-administered encounter medications on file as of 09/10/2024.    Social History: Social History   Tobacco Use   Smoking status: Former    Types: Cigarettes   Smokeless tobacco: Never   Tobacco comments:    Use to smoke 1 pack a day of cigarettes-quit around 2016  Vaping Use   Vaping status: Every Day  Substance Use Topics   Alcohol use: Not Currently   Drug use: No    Family Medical History: Family History  Problem Relation Age of Onset   Hypertension Father    ADD / ADHD Brother     Physical Examination: Vitals:   09/10/24 1307  BP: 124/82    General: Patient is well developed, well nourished, calm, collected, and in no apparent distress. Attention to examination is appropriate.  Respiratory: Patient is breathing without any difficulty.   NEUROLOGICAL:     Awake, alert, oriented to person, place, and time.  Speech is clear and fluent. Fund of knowledge is appropriate.   Cranial Nerves: Pupils equal round and reactive to light.  Facial tone is symmetric.    No abnormal lesions on exposed skin.   Strength: Side Biceps Triceps Deltoid Interossei Grip Wrist Ext. Wrist Flex.  R 5 5 5 5 5 5 5   L 5 5 5  5  5 5 5    Side Iliopsoas Quads Hamstring PF DF EHL  R 5 5 5 5 5 5   L 5 5 5 5 5 5    Reflexes are 2+ and symmetric at the biceps, brachioradialis, patella and achilles.   Hoffman's is absent.  Clonus is not present.   Bilateral upper and lower extremity sensation is intact to light touch.     No pain with IR/ER of both hips.   Gait is normal.   No difficulty with tandem gait.    Medical Decision Making  Imaging: Lumbar xrays dated 08/12/24:  FINDINGS: There is no evidence of lumbar  spine fracture. Alignment is normal. Intervertebral disc spaces are maintained.   IMPRESSION: Negative.     Electronically Signed   By: Luke Bun M.D.   On: 08/13/2024 23:47  I have personally reviewed the images and agree with the above interpretation.  Assessment and Plan: [name] has a history of chronic intermittent LBP x years. This flare up started on 07/19/24 after lifting tree limb. He had constant LBP with right posterior leg pain to his foot initially.   He has still has constant dull/aching pain in right lower back/buttock pain. No current leg pain. No numbness, tingling, or weakness in legs.   Lumbar xrays looked good.   Treatment options discussed with patient and following plan made:   - Current pain is tolerable. He will finish out PT after his next visit.  - He will follow prn for flare ups.   I spent a total of 25 minutes in face-to-face and non-face-to-face activities related to this patient's care today including review of outside records, review of imaging, review of symptoms, physical exam, discussion of differential diagnosis, discussion of treatment options, and documentation.   Thank you for involving me in the care of this patient.   Glade Boys PA-C Dept. of Neurosurgery

## 2024-09-08 ENCOUNTER — Ambulatory Visit

## 2024-09-08 DIAGNOSIS — G8929 Other chronic pain: Secondary | ICD-10-CM

## 2024-09-08 DIAGNOSIS — M5441 Lumbago with sciatica, right side: Secondary | ICD-10-CM | POA: Diagnosis not present

## 2024-09-08 NOTE — Therapy (Signed)
 OUTPATIENT PHYSICAL THERAPY THORACOLUMBAR TREATMENT   Patient Name: CASEN PRYOR MRN: [id number] DOB:May 03, 1986, 38 y.o., male Today's Date: 09/08/2024  END OF SESSION:  PT End of Session - 09/08/24 0824     Visit Number 5    Number of Visits 12    Date for Recertification  09/28/24    PT Start Time 0830    PT Stop Time 0910    PT Time Calculation (min) 40 min    Activity Tolerance Patient tolerated treatment well    Behavior During Therapy [facility] for tasks assessed/performed            Past Medical History:  Diagnosis Date   Varicose vein of scrotum    Past Surgical History:  Procedure Laterality Date   testicular varicose vein surgery     WISDOM TOOTH EXTRACTION     WISDOM TOOTH EXTRACTION     Patient Active Problem List   Diagnosis Date Noted   Snoring 06/18/2023   Performance enhancing drug abuse (HCC) 05/02/2023   Witnessed episode of apnea 05/02/2023   Elevated liver enzymes 05/02/2023   Current every day vaping 05/02/2023    PCP: Sherlynn Madden, MD  REFERRING PROVIDER: Sherlynn Madden, MD  REFERRING DIAG: M54.50,G89.29 (ICD-10-CM) - Chronic right-sided low back pain without sciatica  Rationale for Evaluation and Treatment: Rehabilitation  THERAPY DIAG:  Chronic right-sided low back pain with right-sided sciatica  ONSET DATE: 2007  SUBJECTIVE:                                                                                                                                                                                           SUBJECTIVE STATEMENT: 09/08/2024  Pt reports no pain since week of eval. He continues to incorporate skills and exercises learned in PT to his weekly exercise routine. Pt agrees to d/c on the 18th.   Pt reports years of low back pain with flare ups that do not last extended periods of time. Pt reports this episode started on October 19th after attempting to lift a branch off of his garage roof. Immediate pain and  shock felt in low back causing pt to lie down for a few minutes. He had trouble sleeping that night. Since onset, the pt has pain constantly with increase from prolonged positioning, bending, and lifting (bracing). He notes alleviated symptoms when offloading the R side. Radiating pain through glute but not down LE.    PERTINENT HISTORY:  Chronic low back pain Performance enhancing drug abuser   PAIN:  Are you having pain? Yes: NPRS scale: 5/10; 8/10 at worst; 4/10 best  Pain location: R upper glute/lower back  Pain description: sharp, shock, dull Aggravating factors: prolonged sitting, prolonged standing  Relieving factors: L side lying, sitting with L side leaning  PRECAUTIONS: None  RED FLAGS: None   WEIGHT BEARING RESTRICTIONS: No  FALLS:  Has patient fallen in last 6 months? No  LIVING ENVIRONMENT: Lives with: lives with their family Lives in: House/apartment Has following equipment at home: None  OCCUPATION: financial planner   PLOF: Independent  PATIENT GOALS: reduce pain, back to lifting  NEXT MD VISIT: 02/09/2025  OBJECTIVE:  Note: Objective measures were completed at Evaluation unless otherwise noted.  DIAGNOSTIC FINDINGS:  Lumbar X-ray: There is no evidence of lumbar spine fracture. Alignment is normal. Intervertebral disc spaces are maintained.  PATIENT SURVEYS:  Modified Oswestry:  MODIFIED OSWESTRY DISABILITY SCALE  Date: 08/17/2024 Score  Pain intensity 1 = The pain is bad, but I can manage without having to take pain medication  2. Personal care (washing, dressing, etc.) 1 =  I can take care of myself normally, but it increases my pain.  3. Lifting 3 = Pain prevents me from lifting heavy weights, but I can manage light to medium weights if they are conveniently positioned  4. Walking 3 =  Pain prevents me from walking more than  mile.  5. Sitting 1 =  I can only sit in my favorite chair as long as I like.  6. Standing 1 =  I can stand as long as I  want but, it increases my pain.  7. Sleeping 1 = I can sleep well only by using pain medication.  8. Social Life 2 = Pain prevents me from participating in more energetic activities (eg. sports, dancing).  9. Traveling 1 =  I can travel anywhere, but it increases my pain.  10. Employment/ Homemaking 1 = My normal homemaking/job activities increase my pain, but I can still perform all that is required of me  Total 15/50 = 30%   Interpretation of scores: Score Category Description  0-20% Minimal Disability The patient can cope with most living activities. Usually no treatment is indicated apart from advice on lifting, sitting and exercise  21-40% Moderate Disability The patient experiences more pain and difficulty with sitting, lifting and standing. Travel and social life are more difficult and they may be disabled from work. Personal care, sexual activity and sleeping are not grossly affected, and the patient can usually be managed by conservative means  41-60% Severe Disability Pain remains the main problem in this group, but activities of daily living are affected. These patients require a detailed investigation  61-80% Crippled Back pain impinges on all aspects of the patient's life. Positive intervention is required  81-100% Bed-bound These patients are either bed-bound or exaggerating their symptoms  Bluford FORBES Zoe DELENA Karon DELENA, et al. Surgery versus conservative management of stable thoracolumbar fracture: the PRESTO feasibility RCT. [place] ([place]): [employer]; 2021 Nov. [facility] Technology Assessment, No. 25.62.) Appendix 3, Oswestry Disability Index category descriptors. Available from: Findjewelers.cz  Minimally Clinically Important Difference (MCID) = 12.8%  COGNITION: Overall cognitive status: Within functional limits for tasks assessed     SENSATION: WFL  POSTURE: rounded shoulders, decreased lumbar lordosis, posterior pelvic tilt, and  weight shift left  PALPATION: Tenderness to R gluteal complex, R proximal hamstring, and R distal lumbar region   LUMBAR ROM:   AROM eval  Flexion Knee height; pain    Extension 90% limited; pain  Right lateral flexion WNL; no pain  Left lateral flexion WNL; pain  Right rotation WNL; no pain  Left rotation WNL; no pain   (Blank rows = not tested)  LOWER EXTREMITY ROM:     Passive  Right eval Left eval  Hip flexion    Hip extension    Hip abduction    Hip adduction    Hip internal rotation WNL; no pain   Hip external rotation WNL; pain   Knee flexion    Knee extension    Ankle dorsiflexion    Ankle plantarflexion    Ankle inversion    Ankle eversion     (Blank rows = not tested)  LOWER EXTREMITY MMT:    MMT Right eval Left eval  Hip flexion    Hip extension    Hip abduction    Hip adduction    Hip internal rotation    Hip external rotation    Knee flexion    Knee extension    Ankle dorsiflexion    Ankle plantarflexion    Ankle inversion    Ankle eversion     (Blank rows = not tested)  LUMBAR SPECIAL TESTS:  Straight leg raise test: Positive on R   FUNCTIONAL TESTS:  Deferred to future date   Functional squat    Hip hinge    Single leg stance   JOINT MOBILITY:    Hypomobile R lumbar facet due to pain   TREATMENT DATE: [facility] Adult PT Treatment:                                                DATE: 09/08/24 Neuromuscular re-ed: Side plank clam shell BTB x 10 B Hip star x 10 B  Tall kneel hip hinge 25lb KB x 12  Heel elevated goblet squat 25lb KB x 10 Goblet squat 25lb KB x 12 BB back squat x10, 25lb per side x 10. 25lb per side with heels elevated x 10  45 degree kick back on hip machine 2x10 (varying weight)    [facility] Adult PT Treatment:                                                DATE: 09/01/2024                                                                                                                              Neuro Re-Ed -   Side plank clam x 8 B Side plank clam YTB x 8 B Hip star x 8 B  Kettlebell RDL 25# 2x10 Kettlebell RDL with red band at waist attached to squat rack 2x10 Foam roller hamstring curls x6 then x9   [facility] Adult PT Treatment:  DATE: 08/17/2024                                                                                                                              Therapeutic exercise -  PPT supine x 10  TrA set x 10  Gluteal set x 10  Clam shell x 10   PATIENT EDUCATION:  Education details: HEP, core function and how to engage, not avoiding movement but modifying instead, diagnosis, prognosis, POC.  Person educated: Patient Education method: Explanation, Demonstration, and Verbal cues Education comprehension: verbalized understanding, returned demonstration, and needs further education  HOME EXERCISE PROGRAM: Access Code: [email or web address] URL: [email or web address] Date: 08/25/2024 Prepared by: Marijo Berber  Exercises - Supine Bridge with Resistance Band  - 1 x daily - 7 x weekly - 3 sets - 10 reps - Seated Hip Internal Rotation with Ball and Resistance  - 1 x daily - 7 x weekly - 3 sets - 10 reps - Sidelying Hip Abduction  - 1 x daily - 7 x weekly - 3 sets - 10 reps  ASSESSMENT:  CLINICAL IMPRESSION: Today's session focused on squat form and hip stability. Pt squats with B foot pronation (L>R), B knee valgus (L>R), and slight pelvic rotation (L posterior, R anterior). Pt advised to slightly ER at the hips/knees to create better stability of the LE's. Pt noted improved squat with this adjustment.  Patient will benefit from skilled PT to decrease pain and improve functional ability.  EVAL Patient is a 38 y.o. male who was seen today for physical therapy evaluation and treatment for R sided low back pain/gluteal pain. Pt presents with lumbar mobility deficits into flexion and extension, decreased tolerance to hip mobility, and  pain radiating from low back to glute on R side. The s/s are correlated with R facet joint impairment causing radicular pain to the upper gluteal region. This pt will benefit from skilled physical therapy to return to PLOF with bed mobility, walking, and recreational activities.   OBJECTIVE IMPAIRMENTS: decreased mobility, difficulty walking, decreased ROM, decreased strength, impaired flexibility, and improper body mechanics.   ACTIVITY LIMITATIONS: lifting, bending, sitting, standing, squatting, and sleeping  PARTICIPATION LIMITATIONS: cleaning, driving, and yard work  PERSONAL FACTORS: Time since onset of injury/illness/exacerbation and 1-2 comorbidities: performance enhancing drug abuse are also affecting patient's functional outcome.   REHAB POTENTIAL: Good  CLINICAL DECISION MAKING: Evolving/moderate complexity  EVALUATION COMPLEXITY: Moderate   GOALS: Goals reviewed with patient? Yes  SHORT TERM GOALS: Target date: 09/07/2024  Pt will be compliant and independent with the HEP to assist with symptom management at home.  Baseline: [id number] Goal status: INITIAL  2.  Pt will report resting pain at or below 3/10 NPRS to improve QOL at work and home.  Baseline: 5/10  Goal status: INITIAL  3.  Pt will be able to initiate LE exercises, from previous lifting program, with at least 25% of max in order to return to PLOF.  Baseline: not  performing LE exercises Goal status: INITIAL   LONG TERM GOALS: Target date: 09/28/2024  Pt will report 3/10 or less pain at worst on NPRS in improve QOL.  Baseline: 8/10 Goal status: INITIAL  2.  Pt will have 20% or less disability score on ODI to demonstrate improved function.  Baseline: 30%  Goal status: INITIAL  3.  Pt will have full and pain free lumbar mobility in order to tolerance bed mobility, lifting, and work tasks.  Baseline: see objective measures  Goal status: INITIAL  PLAN:  PT FREQUENCY: 1-2x/week  PT DURATION: 6  weeks  PLANNED INTERVENTIONS: 97110-Therapeutic exercises, 97530- Therapeutic activity, [id number]- Neuromuscular re-education, 97535- Self Care, [id number]- Manual therapy, [id number]- Electrical stimulation (unattended), 20560 (1-2 muscles), 20561 (3+ muscles)- Dry Needling, Patient/Family education, Joint manipulation, and Spinal mobilization.  PLAN FOR NEXT SESSION: rotational movements with bands    Marijo DELENA Berber, PT 09/08/2024, 9:43 AM

## 2024-09-10 ENCOUNTER — Encounter: Payer: Self-pay | Admitting: Orthopedic Surgery

## 2024-09-10 ENCOUNTER — Ambulatory Visit: Admitting: Orthopedic Surgery

## 2024-09-10 ENCOUNTER — Ambulatory Visit

## 2024-09-10 DIAGNOSIS — M545 Low back pain, unspecified: Secondary | ICD-10-CM | POA: Diagnosis not present

## 2024-09-10 DIAGNOSIS — M5441 Lumbago with sciatica, right side: Secondary | ICD-10-CM | POA: Diagnosis not present

## 2024-09-10 DIAGNOSIS — G8929 Other chronic pain: Secondary | ICD-10-CM

## 2024-09-10 NOTE — Therapy (Signed)
 OUTPATIENT PHYSICAL THERAPY THORACOLUMBAR TREATMENT   Patient Name: Daniel Garrett MRN: [id number] DOB:[birth year], 38 y.o., male Today's Date: 09/10/2024  END OF SESSION:  PT End of Session - 09/10/24 0823     Visit Number 6    Number of Visits 12    Date for Recertification  09/28/24    PT Start Time 0830    PT Stop Time 0910    PT Time Calculation (min) 40 min    Activity Tolerance Patient tolerated treatment well    Behavior During Therapy [facility] for tasks assessed/performed             Past Medical History:  Diagnosis Date   Varicose vein of scrotum    Past Surgical History:  Procedure Laterality Date   testicular varicose vein surgery     WISDOM TOOTH EXTRACTION     WISDOM TOOTH EXTRACTION     Patient Active Problem List   Diagnosis Date Noted   Snoring 06/18/2023   Performance enhancing drug abuse (HCC) 05/02/2023   Witnessed episode of apnea 05/02/2023   Elevated liver enzymes 05/02/2023   Current every day vaping 05/02/2023    PCP: Sherlynn Madden, MD  REFERRING PROVIDER: Sherlynn Madden, MD  REFERRING DIAG: M54.50,G89.29 (ICD-10-CM) - Chronic right-sided low back pain without sciatica  Rationale for Evaluation and Treatment: Rehabilitation  THERAPY DIAG:  Chronic right-sided low back pain with right-sided sciatica  ONSET DATE: 2007  SUBJECTIVE:                                                                                                                                                                                           SUBJECTIVE STATEMENT: 09/10/2024  Pt reports no pain since week of eval. He continues to incorporate skills and exercises learned in PT to his weekly exercise routine. Pt agrees to d/c on the 18th.   Pt reports years of low back pain with flare ups that do not last extended periods of time. Pt reports this episode started on October 19th after attempting to lift a branch off of his garage roof. Immediate pain  and shock felt in low back causing pt to lie down for a few minutes. He had trouble sleeping that night. Since onset, the pt has pain constantly with increase from prolonged positioning, bending, and lifting (bracing). He notes alleviated symptoms when offloading the R side. Radiating pain through glute but not down LE.    PERTINENT HISTORY:  Chronic low back pain Performance enhancing drug abuser   PAIN:  Are you having pain? Yes: NPRS scale: 5/10; 8/10 at worst; 4/10 best  Pain location: R upper glute/lower  back Pain description: sharp, shock, dull Aggravating factors: prolonged sitting, prolonged standing  Relieving factors: L side lying, sitting with L side leaning  PRECAUTIONS: None  RED FLAGS: None   WEIGHT BEARING RESTRICTIONS: No  FALLS:  Has patient fallen in last 6 months? No  LIVING ENVIRONMENT: Lives with: lives with their family Lives in: House/apartment Has following equipment at home: None  OCCUPATION: financial planner   PLOF: Independent  PATIENT GOALS: reduce pain, back to lifting  NEXT MD VISIT: 02/09/2025  OBJECTIVE:  Note: Objective measures were completed at Evaluation unless otherwise noted.  DIAGNOSTIC FINDINGS:  Lumbar X-ray: There is no evidence of lumbar spine fracture. Alignment is normal. Intervertebral disc spaces are maintained.  PATIENT SURVEYS:  Modified Oswestry:  MODIFIED OSWESTRY DISABILITY SCALE  Date: 08/17/2024 Score  Pain intensity 1 = The pain is bad, but I can manage without having to take pain medication  2. Personal care (washing, dressing, etc.) 1 =  I can take care of myself normally, but it increases my pain.  3. Lifting 3 = Pain prevents me from lifting heavy weights, but I can manage light to medium weights if they are conveniently positioned  4. Walking 3 =  Pain prevents me from walking more than  mile.  5. Sitting 1 =  I can only sit in my favorite chair as long as I like.  6. Standing 1 =  I can stand as long as  I want but, it increases my pain.  7. Sleeping 1 = I can sleep well only by using pain medication.  8. Social Life 2 = Pain prevents me from participating in more energetic activities (eg. sports, dancing).  9. Traveling 1 =  I can travel anywhere, but it increases my pain.  10. Employment/ Homemaking 1 = My normal homemaking/job activities increase my pain, but I can still perform all that is required of me  Total 15/50 = 30%   Interpretation of scores: Score Category Description  0-20% Minimal Disability The patient can cope with most living activities. Usually no treatment is indicated apart from advice on lifting, sitting and exercise  21-40% Moderate Disability The patient experiences more pain and difficulty with sitting, lifting and standing. Travel and social life are more difficult and they may be disabled from work. Personal care, sexual activity and sleeping are not grossly affected, and the patient can usually be managed by conservative means  41-60% Severe Disability Pain remains the main problem in this group, but activities of daily living are affected. These patients require a detailed investigation  61-80% Crippled Back pain impinges on all aspects of the patients life. Positive intervention is required  81-100% Bed-bound These patients are either bed-bound or exaggerating their symptoms  Bluford FORBES Zoe DELENA Karon DELENA, et al. Surgery versus conservative management of stable thoracolumbar fracture: the PRESTO feasibility RCT. [place] ([place]): [employer]; 2021 Nov. [facility] Technology Assessment, No. 25.62.) Appendix 3, Oswestry Disability Index category descriptors. Available from: Findjewelers.cz  Minimally Clinically Important Difference (MCID) = 12.8%  COGNITION: Overall cognitive status: Within functional limits for tasks assessed     SENSATION: WFL  POSTURE: rounded shoulders, decreased lumbar lordosis, posterior pelvic tilt, and  weight shift left  PALPATION: Tenderness to R gluteal complex, R proximal hamstring, and R distal lumbar region   LUMBAR ROM:   AROM eval  Flexion Knee height; pain    Extension 90% limited; pain  Right lateral flexion WNL; no pain  Left lateral flexion WNL; pain  Right rotation WNL; no pain  Left rotation WNL; no pain   (Blank rows = not tested)  LOWER EXTREMITY ROM:     Passive  Right eval Left eval  Hip flexion    Hip extension    Hip abduction    Hip adduction    Hip internal rotation WNL; no pain   Hip external rotation WNL; pain   Knee flexion    Knee extension    Ankle dorsiflexion    Ankle plantarflexion    Ankle inversion    Ankle eversion     (Blank rows = not tested)  LOWER EXTREMITY MMT:    MMT Right eval Left eval  Hip flexion    Hip extension    Hip abduction    Hip adduction    Hip internal rotation    Hip external rotation    Knee flexion    Knee extension    Ankle dorsiflexion    Ankle plantarflexion    Ankle inversion    Ankle eversion     (Blank rows = not tested)  LUMBAR SPECIAL TESTS:  Straight leg raise test: Positive on R   FUNCTIONAL TESTS:  Deferred to future date   Functional squat    Hip hinge    Single leg stance   JOINT MOBILITY:    Hypomobile R lumbar facet due to pain   TREATMENT DATE: [facility] Adult PT Treatment:                                                DATE: 09/10/24 Neuromuscular re-ed: LTR with ball x 10 B  Open book s/l x 10 B  Standing rotation and anti rotation with landmine x 5 each  1/2 kneel rotational press with landmine x 10 B  Cursty lunge with landmine x 10 B Tall kneel anti rotation with landmine x 10  B-stance rotation RDL with landmine x 5 B    [facility] Adult PT Treatment:                                                DATE: 09/01/2024                                                                                                                              Neuro Re-Ed -  Side plank clam  x 8 B Side plank clam YTB x 8 B Hip star x 8 B  Kettlebell RDL 25# 2x10 Kettlebell RDL with red band at waist attached to squat rack 2x10 Foam roller hamstring curls x6 then x9   [facility] Adult PT Treatment:  DATE: 08/17/2024                                                                                                                              Therapeutic exercise -  PPT supine x 10  TrA set x 10  Gluteal set x 10  Clam shell x 10   PATIENT EDUCATION:  Education details: HEP, core function and how to engage, not avoiding movement but modifying instead, diagnosis, prognosis, POC.  Person educated: Patient Education method: Explanation, Demonstration, and Verbal cues Education comprehension: verbalized understanding, returned demonstration, and needs further education  HOME EXERCISE PROGRAM: Access Code: [email or web address] URL: [email or web address] Date: 08/25/2024 Prepared by: Marijo Berber  Exercises - Supine Bridge with Resistance Band  - 1 x daily - 7 x weekly - 3 sets - 10 reps - Seated Hip Internal Rotation with Ball and Resistance  - 1 x daily - 7 x weekly - 3 sets - 10 reps - Sidelying Hip Abduction  - 1 x daily - 7 x weekly - 3 sets - 10 reps  ASSESSMENT:  CLINICAL IMPRESSION: Today's session focused on rotational movements under load. The pt does not typically move into rotation with exercise causing weakness of rotators and poor ROM. His L hip lacks AROM IR compared to the R. No pain this visit.  Patient will be discharged next session.   EVAL Patient is a 38 y.o. male who was seen today for physical therapy evaluation and treatment for R sided low back pain/gluteal pain. Pt presents with lumbar mobility deficits into flexion and extension, decreased tolerance to hip mobility, and pain radiating from low back to glute on R side. The s/s are correlated with R facet joint impairment causing radicular pain to the upper  gluteal region. This pt will benefit from skilled physical therapy to return to PLOF with bed mobility, walking, and recreational activities.   OBJECTIVE IMPAIRMENTS: decreased mobility, difficulty walking, decreased ROM, decreased strength, impaired flexibility, and improper body mechanics.   ACTIVITY LIMITATIONS: lifting, bending, sitting, standing, squatting, and sleeping  PARTICIPATION LIMITATIONS: cleaning, driving, and yard work  PERSONAL FACTORS: Time since onset of injury/illness/exacerbation and 1-2 comorbidities: performance enhancing drug abuse are also affecting patient's functional outcome.   REHAB POTENTIAL: Good  CLINICAL DECISION MAKING: Evolving/moderate complexity  EVALUATION COMPLEXITY: Moderate   GOALS: Goals reviewed with patient? Yes  SHORT TERM GOALS: Target date: 09/07/2024  Pt will be compliant and independent with the HEP to assist with symptom management at home.  Baseline: [id number] Goal status: INITIAL  2.  Pt will report resting pain at or below 3/10 NPRS to improve QOL at work and home.  Baseline: 5/10  Goal status: INITIAL  3.  Pt will be able to initiate LE exercises, from previous lifting program, with at least 25% of max in order to return to PLOF.  Baseline: not performing LE exercises Goal status: INITIAL   LONG TERM GOALS: Target date: 09/28/2024  Pt will  report 3/10 or less pain at worst on NPRS in improve QOL.  Baseline: 8/10 Goal status: INITIAL  2.  Pt will have 20% or less disability score on ODI to demonstrate improved function.  Baseline: 30%  Goal status: INITIAL  3.  Pt will have full and pain free lumbar mobility in order to tolerance bed mobility, lifting, and work tasks.  Baseline: see objective measures  Goal status: INITIAL  PLAN:  PT FREQUENCY: 1-2x/week  PT DURATION: 6 weeks  PLANNED INTERVENTIONS: 97110-Therapeutic exercises, 97530- Therapeutic activity, [id number]- Neuromuscular re-education, 97535- Self Care,  97140- Manual therapy, G0283- Electrical stimulation (unattended), 20560 (1-2 muscles), 20561 (3+ muscles)- Dry Needling, Patient/Family education, Joint manipulation, and Spinal mobilization.  PLAN FOR NEXT SESSION: d/c    Marijo DELENA Berber, PT 09/10/2024, 10:24 AM

## 2024-09-17 ENCOUNTER — Ambulatory Visit

## 2024-10-14 ENCOUNTER — Ambulatory Visit: Attending: Sports Medicine

## 2024-10-14 DIAGNOSIS — M5441 Lumbago with sciatica, right side: Secondary | ICD-10-CM | POA: Diagnosis present

## 2024-10-14 DIAGNOSIS — G8929 Other chronic pain: Secondary | ICD-10-CM | POA: Diagnosis present

## 2024-10-14 NOTE — Therapy (Signed)
 " OUTPATIENT PHYSICAL THERAPY THORACOLUMBAR TREATMENT   Patient Name: Daniel Garrett MRN: [id number] DOB:02-Oct-1985, 39 y.o., male Today's Date: 10/14/2024  END OF SESSION:  PT End of Session - 10/14/24 0912     Visit Number 7    Number of Visits 12    Date for Recertification  09/28/24    Authorization Type [employer]    PT Start Time 0910    PT Stop Time 0948    PT Time Calculation (min) 38 min          Past Medical History:  Diagnosis Date   Varicose vein of scrotum    Past Surgical History:  Procedure Laterality Date   testicular varicose vein surgery     WISDOM TOOTH EXTRACTION     WISDOM TOOTH EXTRACTION     Patient Active Problem List   Diagnosis Date Noted   Snoring 06/18/2023   Performance enhancing drug abuse (HCC) 05/02/2023   Witnessed episode of apnea 05/02/2023   Elevated liver enzymes 05/02/2023   Current every day vaping 05/02/2023    PCP: Sherlynn Madden, MD  REFERRING PROVIDER: Sherlynn Madden, MD  REFERRING DIAG: M54.50,G89.29 (ICD-10-CM) - Chronic right-sided low back pain without sciatica  Rationale for Evaluation and Treatment: Rehabilitation  THERAPY DIAG:  Chronic right-sided low back pain with right-sided sciatica  ONSET DATE: 2007  SUBJECTIVE:                                                                                                                                                                                           SUBJECTIVE STATEMENT: Discharge Pt reports his pain has subsided and he is able to return to all previous activities. He has some R glute tingling with prolonged sitting but this subsides with movement.   EVAL Pt reports years of low back pain with flare ups that do not last extended periods of time. Pt reports this episode started on October 19th after attempting to lift a branch off of his garage roof. Immediate pain and shock felt in low back causing pt to lie down for a few minutes. He had  trouble sleeping that night. Since onset, the pt has pain constantly with increase from prolonged positioning, bending, and lifting (bracing). He notes alleviated symptoms when offloading the R side. Radiating pain through glute but not down LE.    PERTINENT HISTORY:  Chronic low back pain Performance enhancing drug abuser   PAIN:  Are you having pain? Yes: NPRS scale: 5/10; 8/10 at worst; 4/10 best  Pain location: R upper glute/lower back Pain description: sharp, shock, dull Aggravating factors: prolonged sitting, prolonged standing  Relieving factors:  L side lying, sitting with L side leaning  PRECAUTIONS: None  RED FLAGS: None   WEIGHT BEARING RESTRICTIONS: No  FALLS:  Has patient fallen in last 6 months? No  LIVING ENVIRONMENT: Lives with: lives with their family Lives in: House/apartment Has following equipment at home: None  OCCUPATION: financial planner   PLOF: Independent  PATIENT GOALS: reduce pain, back to lifting  NEXT MD VISIT: 02/09/2025  OBJECTIVE:  Note: Objective measures were completed at Evaluation unless otherwise noted.  DIAGNOSTIC FINDINGS:  Lumbar X-ray: There is no evidence of lumbar spine fracture. Alignment is normal. Intervertebral disc spaces are maintained.  PATIENT SURVEYS:  Modified Oswestry:  MODIFIED OSWESTRY DISABILITY SCALE 10/14/2024  Date: 08/17/2024 Score Score  Pain intensity 1 = The pain is bad, but I can manage without having to take pain medication 0  2. Personal care (washing, dressing, etc.) 1 =  I can take care of myself normally, but it increases my pain. 0  3. Lifting 3 = Pain prevents me from lifting heavy weights, but I can manage light to medium weights if they are conveniently positioned 0  4. Walking 3 =  Pain prevents me from walking more than  mile. 0  5. Sitting 1 =  I can only sit in my favorite chair as long as I like. 0  6. Standing 1 =  I can stand as long as I want but, it increases my pain. 0  7. Sleeping 1  = I can sleep well only by using pain medication. 0  8. Social Life 2 = Pain prevents me from participating in more energetic activities (eg. sports, dancing). 0  9. Traveling 1 =  I can travel anywhere, but it increases my pain. 1  10. Employment/ Homemaking 1 = My normal homemaking/job activities increase my pain, but I can still perform all that is required of me 0  Total 15/50 = 30% 1/50   Interpretation of scores: Score Category Description  0-20% Minimal Disability The patient can cope with most living activities. Usually no treatment is indicated apart from advice on lifting, sitting and exercise  21-40% Moderate Disability The patient experiences more pain and difficulty with sitting, lifting and standing. Travel and social life are more difficult and they may be disabled from work. Personal care, sexual activity and sleeping are not grossly affected, and the patient can usually be managed by conservative means  41-60% Severe Disability Pain remains the main problem in this group, but activities of daily living are affected. These patients require a detailed investigation  61-80% Crippled Back pain impinges on all aspects of the patients life. Positive intervention is required  81-100% Bed-bound These patients are either bed-bound or exaggerating their symptoms  Bluford FORBES Zoe DELENA Karon DELENA, et al. Surgery versus conservative management of stable thoracolumbar fracture: the PRESTO feasibility RCT. [place] ([place]): [employer]; 2021 Nov. [facility] Technology Assessment, No. 25.62.) Appendix 3, Oswestry Disability Index category descriptors. Available from: Findjewelers.cz  Minimally Clinically Important Difference (MCID) = 12.8%  COGNITION: Overall cognitive status: Within functional limits for tasks assessed     SENSATION: WFL  POSTURE: rounded shoulders, decreased lumbar lordosis, posterior pelvic tilt, and weight shift  left  PALPATION: Tenderness to R gluteal complex, R proximal hamstring, and R distal lumbar region   LUMBAR ROM:   AROM eval 10/14/2024  Flexion Knee height; pain   Fingers on floor  Extension 90% limited; pain WNL  Right lateral flexion WNL; no pain   Left  lateral flexion WNL; pain    Right rotation WNL; no pain   Left rotation WNL; no pain    (Blank rows = not tested)  LOWER EXTREMITY ROM:     Passive  Right eval Left eval Right 10/14/2024  Hip flexion     Hip extension     Hip abduction     Hip adduction     Hip internal rotation WNL; no pain  WNL  Hip external rotation WNL; pain  WNL  Knee flexion     Knee extension     Ankle dorsiflexion     Ankle plantarflexion     Ankle inversion     Ankle eversion      (Blank rows = not tested)  LOWER EXTREMITY MMT:    MMT Right eval Left eval  Hip flexion    Hip extension    Hip abduction    Hip adduction    Hip internal rotation    Hip external rotation    Knee flexion    Knee extension    Ankle dorsiflexion    Ankle plantarflexion    Ankle inversion    Ankle eversion     (Blank rows = not tested)  LUMBAR SPECIAL TESTS:  Straight leg raise test: Positive on R  10/14/2024 - negative   FUNCTIONAL TESTS:  Deferred to future date   Functional squat    Hip hinge    Single leg stance   JOINT MOBILITY:    Hypomobile R lumbar facet due to pain    10/14/2024 - normal, no pain  TREATMENT DATE: [facility] Adult PT Treatment:                                                DATE: 10/14/2024   Therapeutic Activity:  Tests and measures    [facility] Adult PT Treatment:                                                DATE: 09/10/24 Neuromuscular re-ed: LTR with ball x 10 B  Open book s/l x 10 B  Standing rotation and anti rotation with landmine x 5 each  1/2 kneel rotational press with landmine x 10 B  Cursty lunge with landmine x 10 B Tall kneel anti rotation with landmine x 10  B-stance rotation RDL with landmine x 5  B    [facility] Adult PT Treatment:                                                DATE: 09/01/2024  Neuro Re-Ed -  Side plank clam x 8 B Side plank clam YTB x 8 B Hip star x 8 B  Kettlebell RDL 25# 2x10 Kettlebell RDL with red band at waist attached to squat rack 2x10 Foam roller hamstring curls x6 then x9   [facility] Adult PT Treatment:                                                DATE: 08/17/2024                                                                                                                              Therapeutic exercise -  PPT supine x 10  TrA set x 10  Gluteal set x 10  Clam shell x 10   PATIENT EDUCATION:  Education details: HEP, core function and how to engage, not avoiding movement but modifying instead, diagnosis, prognosis, POC.  Person educated: Patient Education method: Explanation, Demonstration, and Verbal cues Education comprehension: verbalized understanding, returned demonstration, and needs further education  HOME EXERCISE PROGRAM: Access Code: [email or web address] URL: [email or web address] Date: 08/25/2024 Prepared by: Marijo Berber  Exercises - Supine Bridge with Resistance Band  - 1 x daily - 7 x weekly - 3 sets - 10 reps - Seated Hip Internal Rotation with Ball and Resistance  - 1 x daily - 7 x weekly - 3 sets - 10 reps - Sidelying Hip Abduction  - 1 x daily - 7 x weekly - 3 sets - 10 reps  ASSESSMENT:  CLINICAL IMPRESSION: Discharge:  Pt arrives without pain and has been performing his HEP prior to lifting. He has met all of his goals. Pt is being d/c as of today.   EVAL Patient is a 39 y.o. male who was seen today for physical therapy evaluation and treatment for R sided low back pain/gluteal pain. Pt presents with lumbar mobility deficits into flexion and extension, decreased tolerance to hip mobility, and  pain radiating from low back to glute on R side. The s/s are correlated with R facet joint impairment causing radicular pain to the upper gluteal region. This pt will benefit from skilled physical therapy to return to PLOF with bed mobility, walking, and recreational activities.   OBJECTIVE IMPAIRMENTS: decreased mobility, difficulty walking, decreased ROM, decreased strength, impaired flexibility, and improper body mechanics.   ACTIVITY LIMITATIONS: lifting, bending, sitting, standing, squatting, and sleeping  PARTICIPATION LIMITATIONS: cleaning, driving, and yard work  PERSONAL FACTORS: Time since onset of injury/illness/exacerbation and 1-2 comorbidities: performance enhancing drug abuse are also affecting patient's functional outcome.   REHAB POTENTIAL: Good  CLINICAL DECISION MAKING: Evolving/moderate complexity  EVALUATION COMPLEXITY: Moderate   GOALS: Goals reviewed with patient? Yes  SHORT TERM GOALS: Target date: 09/07/2024  Pt will be compliant and independent with the HEP to assist with symptom management  at home.  Baseline: [id number] Goal status: MET  2.  Pt will report resting pain at or below 3/10 NPRS to improve QOL at work and home.  Baseline: 5/10  Goal status: MET  3.  Pt will be able to initiate LE exercises, from previous lifting program, with at least 25% of max in order to return to PLOF.  Baseline: not performing LE exercises Goal status: MET   LONG TERM GOALS: Target date: 09/28/2024  Pt will report 3/10 or less pain at worst on NPRS in improve QOL.  Baseline: 8/10 Goal status: MET  2.  Pt will have 20% or less disability score on ODI to demonstrate improved function.  Baseline: 30%  Goal status: MET  3.  Pt will have full and pain free lumbar mobility in order to tolerance bed mobility, lifting, and work tasks.  Baseline: see objective measures  Goal status: MET  PLAN:  PT FREQUENCY: 1-2x/week  PT DURATION: 6 weeks  PLANNED INTERVENTIONS:  97110-Therapeutic exercises, 97530- Therapeutic activity, [id number]- Neuromuscular re-education, 97535- Self Care, [id number]- Manual therapy, G0283- Electrical stimulation (unattended), 20560 (1-2 muscles), 20561 (3+ muscles)- Dry Needling, Patient/Family education, Joint manipulation, and Spinal mobilization.  PLAN FOR NEXT SESSION: d/c    Marijo DELENA Berber, PT 10/14/2024, 9:14 AM  "

## 2025-02-09 ENCOUNTER — Ambulatory Visit: Payer: Self-pay | Admitting: Sports Medicine
